# Patient Record
Sex: Female | Born: 1985
Health system: Southern US, Community
[De-identification: ages and names within clinical notes are randomized; demographics above are authoritative.]

## PROBLEM LIST (undated history)

## (undated) DIAGNOSIS — R519 Headache, unspecified: Secondary | ICD-10-CM

## (undated) DIAGNOSIS — K219 Gastro-esophageal reflux disease without esophagitis: Secondary | ICD-10-CM

## (undated) DIAGNOSIS — R51 Headache: Secondary | ICD-10-CM

## (undated) DIAGNOSIS — O34219 Maternal care for unspecified type scar from previous cesarean delivery: Secondary | ICD-10-CM

## (undated) HISTORY — PX: APPENDECTOMY: SHX54

---

## 2017-09-08 DIAGNOSIS — Z363 Encounter for antenatal screening for malformations: Secondary | ICD-10-CM | POA: Diagnosis not present

## 2017-09-08 DIAGNOSIS — K219 Gastro-esophageal reflux disease without esophagitis: Secondary | ICD-10-CM | POA: Diagnosis not present

## 2017-09-08 DIAGNOSIS — O34211 Maternal care for low transverse scar from previous cesarean delivery: Secondary | ICD-10-CM | POA: Diagnosis not present

## 2017-09-08 DIAGNOSIS — Z3A17 17 weeks gestation of pregnancy: Secondary | ICD-10-CM | POA: Diagnosis not present

## 2017-09-08 DIAGNOSIS — O219 Vomiting of pregnancy, unspecified: Secondary | ICD-10-CM | POA: Diagnosis not present

## 2017-10-11 DIAGNOSIS — Z3A22 22 weeks gestation of pregnancy: Secondary | ICD-10-CM | POA: Diagnosis not present

## 2017-10-11 DIAGNOSIS — Z362 Encounter for other antenatal screening follow-up: Secondary | ICD-10-CM | POA: Diagnosis not present

## 2017-10-19 DIAGNOSIS — Z3A23 23 weeks gestation of pregnancy: Secondary | ICD-10-CM | POA: Diagnosis not present

## 2017-10-19 DIAGNOSIS — R52 Pain, unspecified: Secondary | ICD-10-CM | POA: Diagnosis not present

## 2017-10-19 LAB — OB RESULTS CONSOLE HEPATITIS B SURFACE ANTIGEN: HEP B S AG: NEGATIVE

## 2017-10-19 LAB — OB RESULTS CONSOLE GC/CHLAMYDIA
Chlamydia: NEGATIVE
Gonorrhea: NEGATIVE

## 2017-10-19 LAB — OB RESULTS CONSOLE ABO/RH: RH Type: POSITIVE

## 2017-10-19 LAB — OB RESULTS CONSOLE HIV ANTIBODY (ROUTINE TESTING): HIV: NONREACTIVE

## 2017-10-19 LAB — OB RESULTS CONSOLE ANTIBODY SCREEN: Antibody Screen: NEGATIVE

## 2017-10-19 LAB — OB RESULTS CONSOLE RUBELLA ANTIBODY, IGM: RUBELLA: IMMUNE

## 2017-10-19 LAB — OB RESULTS CONSOLE RPR: RPR: NONREACTIVE

## 2017-11-21 DIAGNOSIS — Z23 Encounter for immunization: Secondary | ICD-10-CM | POA: Diagnosis not present

## 2017-11-21 DIAGNOSIS — Z3689 Encounter for other specified antenatal screening: Secondary | ICD-10-CM | POA: Diagnosis not present

## 2017-12-14 DIAGNOSIS — M546 Pain in thoracic spine: Secondary | ICD-10-CM | POA: Diagnosis not present

## 2017-12-14 DIAGNOSIS — M9903 Segmental and somatic dysfunction of lumbar region: Secondary | ICD-10-CM | POA: Diagnosis not present

## 2017-12-14 DIAGNOSIS — M9901 Segmental and somatic dysfunction of cervical region: Secondary | ICD-10-CM | POA: Diagnosis not present

## 2017-12-14 DIAGNOSIS — M9902 Segmental and somatic dysfunction of thoracic region: Secondary | ICD-10-CM | POA: Diagnosis not present

## 2018-01-19 DIAGNOSIS — Z3685 Encounter for antenatal screening for Streptococcus B: Secondary | ICD-10-CM | POA: Diagnosis not present

## 2018-01-19 LAB — OB RESULTS CONSOLE GBS: GBS: POSITIVE

## 2018-01-26 DIAGNOSIS — O34211 Maternal care for low transverse scar from previous cesarean delivery: Secondary | ICD-10-CM | POA: Diagnosis not present

## 2018-01-26 DIAGNOSIS — Z3A00 Weeks of gestation of pregnancy not specified: Secondary | ICD-10-CM | POA: Diagnosis not present

## 2018-01-30 ENCOUNTER — Telehealth (HOSPITAL_COMMUNITY): Payer: Self-pay | Admitting: *Deleted

## 2018-01-30 ENCOUNTER — Encounter (HOSPITAL_COMMUNITY): Payer: Self-pay | Admitting: *Deleted

## 2018-01-30 NOTE — Telephone Encounter (Signed)
Preadmission screen  

## 2018-02-01 HISTORY — DX: Maternal care for unspecified type scar from previous cesarean delivery: O34.219

## 2018-02-02 ENCOUNTER — Telehealth (HOSPITAL_COMMUNITY): Payer: Self-pay | Admitting: *Deleted

## 2018-02-02 NOTE — Telephone Encounter (Signed)
Preadmission screen  

## 2018-02-03 ENCOUNTER — Encounter (HOSPITAL_COMMUNITY): Payer: Self-pay

## 2018-02-06 ENCOUNTER — Encounter (HOSPITAL_COMMUNITY): Payer: Self-pay | Admitting: Obstetrics and Gynecology

## 2018-02-06 DIAGNOSIS — O34219 Maternal care for unspecified type scar from previous cesarean delivery: Secondary | ICD-10-CM

## 2018-02-06 NOTE — H&P (Signed)
Tracy Evans is a 33 y.o. female G2P1001 at 39wk for repeat cesarean section - given 39 weeks and no labor.  She is dated by LMP c/d early [place].  Has migraines w aura - worse outside of pregnancy.  Received Tdap and Flu vaccine w pregnancy.  Transfer of care from [place] at 17 weeks.  D/W her r/b/a of rLTCS.    OB History    Gravida  2   Para  1   Term  1   Preterm      AB      Living  1     SAB      TAB      Ectopic      Multiple      Live Births  1         no abn pap - last 3/18 No STD G1 03/03/16, 7#3, female - fetal intol of labor, arrest G2 present  Past Medical History:  Diagnosis Date  . GERD (gastroesophageal reflux disease)   . H/O cesarean section complicating pregnancy 02/06/2018  . Headache   h/o kidney stones  Past Surgical History:  Procedure Laterality Date  . APPENDECTOMY    . CESAREAN SECTION     Family History: family history includes Cancer in her maternal grandmother and paternal grandmother.asthma, CVA, hearing loss, kidney stones Social History:  reports that she has never smoked. She has never used smokeless tobacco. She reports previous alcohol use. She reports that she does not use drugs.married SAHM  Meds Nexium, PNV All NKDA     Maternal Diabetes: No Genetic Screening: Declined Maternal Ultrasounds/Referrals: Normal Fetal Ultrasounds or other Referrals:  None Maternal Substance Abuse:  No Significant Maternal Medications:  None Significant Maternal Lab Results:  Lab values include: Group B Strep positive Other Comments:  None  Review of Systems  Constitutional: Negative.   HENT: Negative.   Eyes: Negative.   Respiratory: Negative.   Cardiovascular: Negative.   Gastrointestinal: Negative.   Genitourinary: Negative.   Musculoskeletal: Negative.   Skin: Negative.   Neurological: Negative.   Psychiatric/Behavioral: Negative.    Maternal Medical History:  Contractions: Frequency: irregular.   Perceived severity is  moderate.    Fetal activity: Perceived fetal activity is normal.    Prenatal complications: no prenatal complications Prenatal Complications - Diabetes: none.      Last menstrual period 05/09/2017. Maternal Exam:  Uterine Assessment: Contraction strength is moderate.  Contraction frequency is irregular.   Abdomen: Patient reports no abdominal tenderness. Surgical scars: low transverse.   Fundal height is appropriate for gestation.   Estimated fetal weight is 7-8#.   Fetal presentation: vertex  Introitus: Normal vulva. Normal vagina.    Physical Exam  Constitutional: She appears well-developed and well-nourished.  HENT:  Head: Normocephalic and atraumatic.  Cardiovascular: Normal rate and regular rhythm.  Respiratory: Effort normal and breath sounds normal. No respiratory distress. She has no wheezes.  GI: Soft. Bowel sounds are normal. She exhibits no distension. There is no abdominal tenderness.  Genitourinary:    Vulva normal.   Musculoskeletal: Normal range of motion.  Neurological: She is alert.  Skin: Skin is warm and dry.  Psychiatric: She has a normal mood and affect. Her behavior is normal.    Prenatal labs: ABO, Rh: AB/Positive/-- (09/18 0000) Antibody: Negative (09/18 0000) Rubella: Immune (09/18 0000) RPR: Nonreactive (09/18 0000)  HBsAg: Negative (09/18 0000)  HIV: Non-reactive (09/18 0000)  GBS: Positive (12/19 0000)  Hgb 12.9/ Plt 246/Ur cx neg/GC neg/Chl  neg/glucola 117  Nl limited anat, ant plac, female F/u completes anatomy  Assessment/Plan: 32yo G2P1001 at 39+ for rLTCS D/w pt r/b/a and process - voices understanding - wishes to proceed Ancef for prophylaxis GBBS + - nursery aware.    Tracy Evans 02/06/2018, 10:02 AM

## 2018-02-08 ENCOUNTER — Encounter (HOSPITAL_COMMUNITY)
Admission: RE | Admit: 2018-02-08 | Discharge: 2018-02-08 | Disposition: A | Discharge status: Home / Self Care | Payer: BLUE CROSS/BLUE SHIELD | Source: Ambulatory Visit | Attending: Obstetrics and Gynecology | Admitting: Obstetrics and Gynecology

## 2018-02-08 ENCOUNTER — Inpatient Hospital Stay (EMERGENCY_DEPARTMENT_HOSPITAL)
Admission: AD | Admit: 2018-02-08 | Discharge: 2018-02-08 | Disposition: A | Discharge status: Home / Self Care | Payer: BLUE CROSS/BLUE SHIELD | Source: Home / Self Care | Attending: Obstetrics and Gynecology | Admitting: Obstetrics and Gynecology

## 2018-02-08 ENCOUNTER — Other Ambulatory Visit: Payer: Self-pay

## 2018-02-08 ENCOUNTER — Encounter (HOSPITAL_COMMUNITY): Payer: Self-pay | Admitting: *Deleted

## 2018-02-08 DIAGNOSIS — O471 False labor at or after 37 completed weeks of gestation: Secondary | ICD-10-CM | POA: Diagnosis not present

## 2018-02-08 DIAGNOSIS — O99824 Streptococcus B carrier state complicating childbirth: Secondary | ICD-10-CM | POA: Diagnosis not present

## 2018-02-08 DIAGNOSIS — Z23 Encounter for immunization: Secondary | ICD-10-CM | POA: Diagnosis not present

## 2018-02-08 DIAGNOSIS — O34219 Maternal care for unspecified type scar from previous cesarean delivery: Secondary | ICD-10-CM

## 2018-02-08 DIAGNOSIS — Z3A39 39 weeks gestation of pregnancy: Secondary | ICD-10-CM

## 2018-02-08 DIAGNOSIS — Z0371 Encounter for suspected problem with amniotic cavity and membrane ruled out: Secondary | ICD-10-CM | POA: Diagnosis not present

## 2018-02-08 DIAGNOSIS — Z3A00 Weeks of gestation of pregnancy not specified: Secondary | ICD-10-CM | POA: Diagnosis not present

## 2018-02-08 DIAGNOSIS — O133 Gestational [pregnancy-induced] hypertension without significant proteinuria, third trimester: Secondary | ICD-10-CM | POA: Diagnosis not present

## 2018-02-08 DIAGNOSIS — O479 False labor, unspecified: Secondary | ICD-10-CM

## 2018-02-08 DIAGNOSIS — O34211 Maternal care for low transverse scar from previous cesarean delivery: Secondary | ICD-10-CM | POA: Diagnosis not present

## 2018-02-08 HISTORY — DX: Gastro-esophageal reflux disease without esophagitis: K21.9

## 2018-02-08 HISTORY — DX: Headache: R51

## 2018-02-08 HISTORY — DX: Headache, unspecified: R51.9

## 2018-02-08 LAB — URINALYSIS, ROUTINE W REFLEX MICROSCOPIC
Bilirubin Urine: NEGATIVE
GLUCOSE, UA: NEGATIVE mg/dL
Ketones, ur: NEGATIVE mg/dL
LEUKOCYTES UA: NEGATIVE
Nitrite: NEGATIVE
Protein, ur: NEGATIVE mg/dL
Specific Gravity, Urine: 1.008 (ref 1.005–1.030)
pH: 7 (ref 5.0–8.0)

## 2018-02-08 LAB — COMPREHENSIVE METABOLIC PANEL
ALT: 15 U/L (ref 0–44)
AST: 18 U/L (ref 15–41)
Albumin: 3.3 g/dL — ABNORMAL LOW (ref 3.5–5.0)
Alkaline Phosphatase: 99 U/L (ref 38–126)
Anion gap: 8 (ref 5–15)
BILIRUBIN TOTAL: 0.4 mg/dL (ref 0.3–1.2)
BUN: 8 mg/dL (ref 6–20)
CO2: 21 mmol/L — ABNORMAL LOW (ref 22–32)
Calcium: 8.8 mg/dL — ABNORMAL LOW (ref 8.9–10.3)
Chloride: 104 mmol/L (ref 98–111)
Creatinine, Ser: 0.58 mg/dL (ref 0.44–1.00)
Glucose, Bld: 90 mg/dL (ref 70–99)
Potassium: 4.1 mmol/L (ref 3.5–5.1)
Sodium: 133 mmol/L — ABNORMAL LOW (ref 135–145)
Total Protein: 7 g/dL (ref 6.5–8.1)

## 2018-02-08 LAB — TYPE AND SCREEN
ABO/RH(D): AB POS
Antibody Screen: NEGATIVE

## 2018-02-08 LAB — CBC
HEMATOCRIT: 40.7 % (ref 36.0–46.0)
Hemoglobin: 13.8 g/dL (ref 12.0–15.0)
MCH: 31.6 pg (ref 26.0–34.0)
MCHC: 33.9 g/dL (ref 30.0–36.0)
MCV: 93.1 fL (ref 80.0–100.0)
Platelets: 197 10*3/uL (ref 150–400)
RBC: 4.37 MIL/uL (ref 3.87–5.11)
RDW: 12.4 % (ref 11.5–15.5)
WBC: 12.4 10*3/uL — ABNORMAL HIGH (ref 4.0–10.5)
nRBC: 0 % (ref 0.0–0.2)

## 2018-02-08 LAB — PROTEIN / CREATININE RATIO, URINE
Creatinine, Urine: 51 mg/dL
Protein Creatinine Ratio: 0.14 mg/mg{Cre} (ref 0.00–0.15)
Total Protein, Urine: 7 mg/dL

## 2018-02-08 LAB — ABO/RH: ABO/RH(D): AB POS

## 2018-02-08 NOTE — MAU Note (Signed)
Presents with c/o LOF since yesterday and ctxs that began last night.  Reports +FM.  Denies VB.

## 2018-02-08 NOTE — Patient Instructions (Signed)
NANDI TEAHAN  02/08/2018   Your procedure is scheduled on:  02/09/2018  Enter through the Main Entrance of [facility] at 0730 AM.  Pick up the phone at the desk and dial [phone]  Call this number if you have problems the morning of surgery:[phone]  Remember:   Do not eat food:(After Midnight) Desps de medianoche.  Do not drink clear liquids: (After Midnight) Desps de medianoche.  Take these medicines the morning of surgery with A SIP OF WATER: may take nexium   Do not wear jewelry, make-up or nail polish.  Do not wear lotions, powders, or perfumes. Do not wear deodorant.  Do not shave 48 hours prior to surgery.  Do not bring valuables to the hospital.  [facility] is not   responsible for any belongings or valuables brought to the hospital.  Contacts, dentures or bridgework may not be worn into surgery.  Leave suitcase in the car. After surgery it may be brought to your room.  For patients admitted to the hospital, checkout time is 11:00 AM the day of              discharge.    N/A   Please read over the following fact sheets that you were given:   Surgical Site Infection Prevention

## 2018-02-08 NOTE — MAU Provider Note (Signed)
Chief Complaint:  Contractions and Rupture of Membranes   None     HPI: Tracy Evans is a 33 y.o. G2P1001 at 615w2d by LMP, who presents to maternity admissions reporting leakage of fluid and cramping/contractions. She has scheduled repeat C/S tomorrow, 02/09/2018.  She reports clear fluid, enough to wet a pantyliner x 3 episodes in 24 hours.  She has not required a pad.  There are contractions with mild pain low in her abdomen every 10 minutes or so  It does not radiate.  She has not tried any treatments. There are no other symptoms. She reports good fetal movement.  HPI  Past Medical History: Past Medical History:  Diagnosis Date  . GERD (gastroesophageal reflux disease)   . H/O cesarean section complicating pregnancy 02/06/2018  . Headache     Past obstetric history: OB History  Gravida Para Term Preterm AB Living  2 1 1     1   SAB TAB Ectopic Multiple Live Births          1    # Outcome Date GA Lbr Len/2nd Weight Sex Delivery Anes PTL Lv  2 Current           1 Term 2018    M CS-LTranv   LIV    Past Surgical History: Past Surgical History:  Procedure Laterality Date  . APPENDECTOMY    . CESAREAN SECTION      Family History: Family History  Problem Relation Age of Onset  . Cancer Maternal Grandmother        br  . Cancer Paternal Grandmother        colon  . Healthy Mother   . Healthy Father     Social History: Social History   Tobacco Use  . Smoking status: Never Smoker  . Smokeless tobacco: Never Used  Substance Use Topics  . Alcohol use: Not Currently  . Drug use: Never    Allergies: No Known Allergies  Meds:  Medications Prior to Admission  Medication Sig Dispense Refill Last Dose  . acetaminophen (TYLENOL) 500 MG tablet Take 1,000 mg by mouth every 8 (eight) hours as needed (pain).     Marland Kitchen. docusate sodium (COLACE) 100 MG capsule Take 300 mg by mouth daily as needed for mild constipation.     Marland Kitchen. doxylamine, Sleep, (UNISOM) 25 MG tablet Take 12.5 mg  by mouth at bedtime.   02/03/2018 at Unknown time  . esomeprazole (NEXIUM) 40 MG capsule Take 40 mg by mouth daily at 12 noon.   02/03/2018 at Unknown time  . Prenatal MV-Min-Fe Fum-FA-DHA (PRENATAL 1 PO) Take 1 tablet by mouth daily. Dannielle Huhainbow Light Brand   02/03/2018 at Unknown time  . pyridOXINE (VITAMIN B-6) 25 MG tablet Take 25 mg by mouth daily.   02/03/2018 at Unknown time    ROS:  Review of Systems  Constitutional: Negative for chills, fatigue and fever.  Eyes: Negative for visual disturbance.  Respiratory: Negative for cough and shortness of breath.   Cardiovascular: Negative for chest pain.  Gastrointestinal: Positive for abdominal pain. Negative for nausea and vomiting.  Genitourinary: Positive for vaginal discharge. Negative for difficulty urinating, dysuria, flank pain, frequency, pelvic pain, urgency, vaginal bleeding and vaginal pain.  Musculoskeletal: Negative.   Neurological: Negative for dizziness and headaches.  Psychiatric/Behavioral: Negative.      I have reviewed patient's Past Medical Hx, Surgical Hx, Family Hx, Social Hx, medications and allergies.   Physical Exam   Patient Vitals for the past 24 hrs:  BP Temp Temp src Pulse Resp SpO2 Height Weight  02/08/18 1115 140/80 - - (!) 101 - 98 % - -  02/08/18 1016 131/80 - - (!) 116 - 98 % - -  02/08/18 1007 - - - - - 98 % - -  02/08/18 1001 140/87 - - (!) 122 - 98 % - -  02/08/18 0945 - - - - - 98 % - -  02/08/18 0944 (!) 149/88 - - (!) 120 - - - -  02/08/18 0923 (!) 149/98 98.2 F (36.8 C) Oral (!) 143 18 100 % - -  02/08/18 0919 - - - - - - 5\' 8"  (1.727 m) 99.7 kg   Constitutional: Well-developed, well-nourished female in no acute distress.  Cardiovascular: normal rate Respiratory: normal effort GI: Abd soft, non-tender, gravid appropriate for gestational age.  MS: Extremities nontender, no edema, normal ROM Neurologic: Alert and oriented x 4.  GU: Neg CVAT.  PELVIC EXAM: Cervix pink, visually closed, without  lesion, small amount tan creamy discharge, no pooling of fluid with valsalva, vaginal walls and external genitalia normal  Dilation: Closed Effacement (%): 30 Cervical Position: Posterior Exam by:: Dr. Ellyn Hack  FHT:  Baseline 150 , moderate variability, accelerations present, no decelerations Contractions: irritability and contractions q 15 mins   Labs: Results for orders placed or performed during the hospital encounter of 02/08/18 (from the past 24 hour(s))  Protein / creatinine ratio, urine     Status: None   Collection Time: 02/08/18  9:23 AM  Result Value Ref Range   Creatinine, Urine 51.00 mg/dL   Total Protein, Urine 7 mg/dL   Protein Creatinine Ratio 0.14 0.00 - 0.15 mg/mg[Cre]  Urinalysis, Routine w reflex microscopic     Status: Abnormal   Collection Time: 02/08/18  9:30 AM  Result Value Ref Range   Color, Urine YELLOW YELLOW   APPearance HAZY (A) CLEAR   Specific Gravity, Urine 1.008 1.005 - 1.030   pH 7.0 5.0 - 8.0   Glucose, UA NEGATIVE NEGATIVE mg/dL   Hgb urine dipstick MODERATE (A) NEGATIVE   Bilirubin Urine NEGATIVE NEGATIVE   Ketones, ur NEGATIVE NEGATIVE mg/dL   Protein, ur NEGATIVE NEGATIVE mg/dL   Nitrite NEGATIVE NEGATIVE   Leukocytes, UA NEGATIVE NEGATIVE   RBC / HPF 0-5 0 - 5 RBC/hpf   WBC, UA 0-5 0 - 5 WBC/hpf   Bacteria, UA RARE (A) NONE SEEN   Squamous Epithelial / LPF 0-5 0 - 5   Mucus PRESENT   CBC     Status: Abnormal   Collection Time: 02/08/18 10:13 AM  Result Value Ref Range   WBC 12.4 (H) 4.0 - 10.5 K/uL   RBC 4.37 3.87 - 5.11 MIL/uL   Hemoglobin 13.8 12.0 - 15.0 g/dL   HCT 21.9 75.8 - 83.2 %   MCV 93.1 80.0 - 100.0 fL   MCH 31.6 26.0 - 34.0 pg   MCHC 33.9 30.0 - 36.0 g/dL   RDW 54.9 82.6 - 41.5 %   Platelets 197 150 - 400 K/uL   nRBC 0.0 0.0 - 0.2 %  Comprehensive metabolic panel     Status: Abnormal   Collection Time: 02/08/18 10:13 AM  Result Value Ref Range   Sodium 133 (L) 135 - 145 mmol/L   Potassium 4.1 3.5 - 5.1 mmol/L    Chloride 104 98 - 111 mmol/L   CO2 21 (L) 22 - 32 mmol/L   Glucose, Bld 90 70 - 99 mg/dL   BUN 8  6 - 20 mg/dL   Creatinine, Ser 9.600.58 0.44 - 1.00 mg/dL   Calcium 8.8 (L) 8.9 - 10.3 mg/dL   Total Protein 7.0 6.5 - 8.1 g/dL   Albumin 3.3 (L) 3.5 - 5.0 g/dL   AST 18 15 - 41 U/L   ALT 15 0 - 44 U/L   Alkaline Phosphatase 99 38 - 126 U/L   Total Bilirubin 0.4 0.3 - 1.2 mg/dL   GFR calc non Af Amer >60 >60 mL/min   GFR calc Af Amer >60 >60 mL/min   Anion gap 8 5 - 15   AB/Positive/-- (09/18 0000)  Imaging:  No results found.  MAU Course/MDM: Orders Placed This Encounter  Procedures  . Urinalysis, Routine w reflex microscopic  . CBC  . Comprehensive metabolic panel  . Protein / creatinine ratio, urine  . RPR  . External Fetal Monitoring for all patients >/= 23 weeks  . Assess fetal heart tones for all patients >/= 12 weeks  . Type and screen  . Discharge patient    No orders of the defined types were placed in this encounter.    NST reviewed and reactive Dr Hinton RaoBovard-Stuckert to MAU to see pt and examine cervix.   In absence of preeclampsia symptoms and with normal labs, and pt normotensive except for 2 intake BPs today, pt does not meet criteria for delivery for gestational hypertension She is to keep her scheduled C/S tomorrow and compete her preop appointment today after D/C from MAU Preeclampsia and labor precautions reviewed Pt discharge with strict return precautions.    Assessment: 1. Transient hypertension of pregnancy in third trimester   2. H/O cesarean section complicating pregnancy   3. False labor   4. Encounter for suspected premature rupture of amniotic membranes, with rupture of membranes not found     Plan: Discharge home Labor precautions and fetal kick counts Follow-up Information    [facility].   Why:  Thursday, 02/09/2018, for your scheduled cesarean section. Return to MAU for signs of labor or emergencies. Contact  information: [address] [place] [phone]         Allergies as of 02/08/2018   No Known Allergies     Medication List    TAKE these medications   acetaminophen 500 MG tablet Commonly known as:  TYLENOL Take 1,000 mg by mouth every 8 (eight) hours as needed (pain).   docusate sodium 100 MG capsule Commonly known as:  COLACE Take 300 mg by mouth daily as needed for mild constipation.   doxylamine (Sleep) 25 MG tablet Commonly known as:  UNISOM Take 12.5 mg by mouth at bedtime.   esomeprazole 40 MG capsule Commonly known as:  NEXIUM Take 40 mg by mouth daily at 12 noon.   PRENATAL 1 PO Take 1 tablet by mouth daily. Rainbow Light Brand   pyridOXINE 25 MG tablet Commonly known as:  VITAMIN B-6 Take 25 mg by mouth daily.       Sharen CounterLisa Leftwich-Kirby Certified Nurse-Midwife 02/08/2018 11:22 AM

## 2018-02-08 NOTE — Discharge Instructions (Signed)

## 2018-02-09 ENCOUNTER — Inpatient Hospital Stay (HOSPITAL_COMMUNITY): Payer: BLUE CROSS/BLUE SHIELD | Admitting: Anesthesiology

## 2018-02-09 ENCOUNTER — Encounter (HOSPITAL_COMMUNITY): Payer: Self-pay | Admitting: *Deleted

## 2018-02-09 ENCOUNTER — Inpatient Hospital Stay (HOSPITAL_COMMUNITY)
Admission: AD | Admit: 2018-02-09 | Discharge: 2018-02-11 | DRG: 788 | Disposition: A | Discharge status: Home / Self Care | Payer: BLUE CROSS/BLUE SHIELD | Attending: Obstetrics and Gynecology | Admitting: Obstetrics and Gynecology

## 2018-02-09 ENCOUNTER — Encounter (HOSPITAL_COMMUNITY)
Admission: AD | Disposition: A | Discharge status: Home / Self Care | Payer: Self-pay | Source: Home / Self Care | Attending: Obstetrics and Gynecology

## 2018-02-09 DIAGNOSIS — Z3A39 39 weeks gestation of pregnancy: Secondary | ICD-10-CM

## 2018-02-09 DIAGNOSIS — O34211 Maternal care for low transverse scar from previous cesarean delivery: Principal | ICD-10-CM | POA: Diagnosis present

## 2018-02-09 DIAGNOSIS — O99824 Streptococcus B carrier state complicating childbirth: Secondary | ICD-10-CM | POA: Diagnosis present

## 2018-02-09 DIAGNOSIS — O34219 Maternal care for unspecified type scar from previous cesarean delivery: Secondary | ICD-10-CM | POA: Diagnosis present

## 2018-02-09 DIAGNOSIS — Z98891 History of uterine scar from previous surgery: Secondary | ICD-10-CM

## 2018-02-09 LAB — RPR: RPR: NONREACTIVE

## 2018-02-09 SURGERY — Surgical Case
Anesthesia: Spinal

## 2018-02-09 MED ORDER — EPHEDRINE 5 MG/ML INJ
INTRAVENOUS | Status: AC
  Filled 2018-02-09: qty 10

## 2018-02-09 MED ORDER — CEFAZOLIN SODIUM-DEXTROSE 2-4 GM/100ML-% IV SOLN
2.0000 g | Freq: Once | INTRAVENOUS | Status: AC
  Administered 2018-02-09: 2 g via INTRAVENOUS
  Filled 2018-02-09: qty 100

## 2018-02-09 MED ORDER — SENNOSIDES-DOCUSATE SODIUM 8.6-50 MG PO TABS
2.0000 | ORAL_TABLET | ORAL | Status: DC
  Administered 2018-02-10 (×2): 2 via ORAL
  Filled 2018-02-09 (×2): qty 2

## 2018-02-09 MED ORDER — NALBUPHINE HCL 10 MG/ML IJ SOLN: 5.0000 mg | Freq: Once | INTRAMUSCULAR | Status: DC | PRN

## 2018-02-09 MED ORDER — ONDANSETRON HCL 4 MG/2ML IJ SOLN: 4.0000 mg | Freq: Three times a day (TID) | INTRAMUSCULAR | Status: DC | PRN

## 2018-02-09 MED ORDER — KETOROLAC TROMETHAMINE 30 MG/ML IJ SOLN
INTRAMUSCULAR | Status: AC
  Administered 2018-02-09: 30 mg via INTRAMUSCULAR
  Filled 2018-02-09: qty 1

## 2018-02-09 MED ORDER — MENTHOL 3 MG MT LOZG: 1.0000 | LOZENGE | OROMUCOSAL | Status: DC | PRN

## 2018-02-09 MED ORDER — SCOPOLAMINE 1 MG/3DAYS TD PT72
MEDICATED_PATCH | TRANSDERMAL | Status: AC
  Filled 2018-02-09: qty 1

## 2018-02-09 MED ORDER — MORPHINE SULFATE (PF) 0.5 MG/ML IJ SOLN
INTRAMUSCULAR | Status: AC
  Filled 2018-02-09: qty 10

## 2018-02-09 MED ORDER — TETANUS-DIPHTH-ACELL PERTUSSIS 5-2.5-18.5 LF-MCG/0.5 IM SUSP: 0.5000 mL | Freq: Once | INTRAMUSCULAR | Status: DC

## 2018-02-09 MED ORDER — COCONUT OIL OIL
1.0000 "application " | TOPICAL_OIL | Status: DC | PRN
  Administered 2018-02-10: 1 via TOPICAL
  Filled 2018-02-09: qty 120

## 2018-02-09 MED ORDER — DIPHENHYDRAMINE HCL 50 MG/ML IJ SOLN: 12.5000 mg | INTRAMUSCULAR | Status: DC | PRN

## 2018-02-09 MED ORDER — OXYTOCIN 40 UNITS IN NORMAL SALINE INFUSION - SIMPLE MED: 2.5000 [IU]/h | INTRAVENOUS | Status: DC

## 2018-02-09 MED ORDER — FENTANYL CITRATE (PF) 100 MCG/2ML IJ SOLN
INTRAMUSCULAR | Status: DC | PRN
  Administered 2018-02-09: 15 ug via INTRATHECAL

## 2018-02-09 MED ORDER — WITCH HAZEL-GLYCERIN EX PADS: 1.0000 "application " | MEDICATED_PAD | CUTANEOUS | Status: DC | PRN

## 2018-02-09 MED ORDER — LACTATED RINGERS IV SOLN
INTRAVENOUS | Status: DC
  Administered 2018-02-09: 17:00:00 via INTRAVENOUS

## 2018-02-09 MED ORDER — SCOPOLAMINE 1 MG/3DAYS TD PT72: 1.0000 | MEDICATED_PATCH | Freq: Once | TRANSDERMAL | Status: DC

## 2018-02-09 MED ORDER — ONDANSETRON HCL 4 MG/2ML IJ SOLN
4.0000 mg | Freq: Four times a day (QID) | INTRAMUSCULAR | Status: DC
  Administered 2018-02-09: 4 mg via INTRAVENOUS
  Filled 2018-02-09: qty 2

## 2018-02-09 MED ORDER — DIPHENHYDRAMINE HCL 25 MG PO CAPS: 25.0000 mg | ORAL_CAPSULE | Freq: Four times a day (QID) | ORAL | Status: DC | PRN

## 2018-02-09 MED ORDER — PHENYLEPHRINE 8 MG IN D5W 100 ML (0.08MG/ML) PREMIX OPTIME
INJECTION | INTRAVENOUS | Status: AC
  Filled 2018-02-09: qty 100

## 2018-02-09 MED ORDER — PANTOPRAZOLE SODIUM 40 MG PO TBEC
40.0000 mg | DELAYED_RELEASE_TABLET | Freq: Every day | ORAL | Status: DC
  Administered 2018-02-10: 40 mg via ORAL
  Filled 2018-02-09 (×2): qty 1

## 2018-02-09 MED ORDER — LACTATED RINGERS IV SOLN
INTRAVENOUS | Status: DC
  Administered 2018-02-09 (×2): via INTRAVENOUS

## 2018-02-09 MED ORDER — MORPHINE SULFATE (PF) 0.5 MG/ML IJ SOLN
INTRAMUSCULAR | Status: DC | PRN
  Administered 2018-02-09: 150 mg via INTRATHECAL

## 2018-02-09 MED ORDER — ONDANSETRON HCL 4 MG/2ML IJ SOLN
INTRAMUSCULAR | Status: AC
  Filled 2018-02-09: qty 2

## 2018-02-09 MED ORDER — MEPERIDINE HCL 25 MG/ML IJ SOLN: 6.2500 mg | INTRAMUSCULAR | Status: DC | PRN

## 2018-02-09 MED ORDER — BUPIVACAINE IN DEXTROSE 0.75-8.25 % IT SOLN
INTRATHECAL | Status: DC | PRN
  Administered 2018-02-09: 1.6 mL via INTRATHECAL

## 2018-02-09 MED ORDER — ONDANSETRON HCL 4 MG/2ML IJ SOLN
INTRAMUSCULAR | Status: DC | PRN
  Administered 2018-02-09: 4 mg via INTRAVENOUS

## 2018-02-09 MED ORDER — LACTATED RINGERS IV SOLN: INTRAVENOUS | Status: DC

## 2018-02-09 MED ORDER — NALOXONE HCL 0.4 MG/ML IJ SOLN: 0.4000 mg | INTRAMUSCULAR | Status: DC | PRN

## 2018-02-09 MED ORDER — PHENYLEPHRINE 8 MG IN D5W 100 ML (0.08MG/ML) PREMIX OPTIME
INJECTION | INTRAVENOUS | Status: DC | PRN
  Administered 2018-02-09: 60 ug/min via INTRAVENOUS

## 2018-02-09 MED ORDER — OXYTOCIN 10 UNIT/ML IJ SOLN
INTRAMUSCULAR | Status: AC
  Filled 2018-02-09: qty 4

## 2018-02-09 MED ORDER — KETOROLAC TROMETHAMINE 30 MG/ML IJ SOLN: 30.0000 mg | Freq: Four times a day (QID) | INTRAMUSCULAR | Status: DC | PRN

## 2018-02-09 MED ORDER — DEXAMETHASONE SODIUM PHOSPHATE 4 MG/ML IJ SOLN
INTRAMUSCULAR | Status: AC
  Filled 2018-02-09: qty 1

## 2018-02-09 MED ORDER — PHENYLEPHRINE 40 MCG/ML (10ML) SYRINGE FOR IV PUSH (FOR BLOOD PRESSURE SUPPORT)
PREFILLED_SYRINGE | INTRAVENOUS | Status: AC
  Filled 2018-02-09: qty 10

## 2018-02-09 MED ORDER — NALOXONE HCL 4 MG/10ML IJ SOLN
1.0000 ug/kg/h | INTRAVENOUS | Status: DC | PRN
  Filled 2018-02-09: qty 5

## 2018-02-09 MED ORDER — NALBUPHINE HCL 10 MG/ML IJ SOLN: 5.0000 mg | INTRAMUSCULAR | Status: DC | PRN

## 2018-02-09 MED ORDER — DEXAMETHASONE SODIUM PHOSPHATE 4 MG/ML IJ SOLN
INTRAMUSCULAR | Status: DC | PRN
  Administered 2018-02-09: 4 mg via INTRAVENOUS

## 2018-02-09 MED ORDER — ZOLPIDEM TARTRATE 5 MG PO TABS: 5.0000 mg | ORAL_TABLET | Freq: Every evening | ORAL | Status: DC | PRN

## 2018-02-09 MED ORDER — SIMETHICONE 80 MG PO CHEW
80.0000 mg | CHEWABLE_TABLET | Freq: Three times a day (TID) | ORAL | Status: DC
  Administered 2018-02-10 – 2018-02-11 (×4): 80 mg via ORAL
  Filled 2018-02-09 (×5): qty 1

## 2018-02-09 MED ORDER — DOCUSATE SODIUM 100 MG PO CAPS: 300.0000 mg | ORAL_CAPSULE | Freq: Every day | ORAL | Status: DC | PRN

## 2018-02-09 MED ORDER — OXYCODONE HCL 5 MG PO TABS
5.0000 mg | ORAL_TABLET | ORAL | Status: DC | PRN
  Administered 2018-02-10 – 2018-02-11 (×2): 10 mg via ORAL
  Filled 2018-02-09 (×2): qty 2

## 2018-02-09 MED ORDER — DIPHENHYDRAMINE HCL 25 MG PO CAPS
25.0000 mg | ORAL_CAPSULE | ORAL | Status: DC | PRN
  Filled 2018-02-09: qty 1

## 2018-02-09 MED ORDER — PROMETHAZINE HCL 25 MG/ML IJ SOLN: 6.2500 mg | INTRAMUSCULAR | Status: DC | PRN

## 2018-02-09 MED ORDER — OXYTOCIN 10 UNIT/ML IJ SOLN
INTRAVENOUS | Status: DC | PRN
  Administered 2018-02-09: 40 [IU] via INTRAVENOUS

## 2018-02-09 MED ORDER — IBUPROFEN 800 MG PO TABS
800.0000 mg | ORAL_TABLET | Freq: Three times a day (TID) | ORAL | Status: DC
  Administered 2018-02-09 – 2018-02-11 (×6): 800 mg via ORAL
  Filled 2018-02-09 (×6): qty 1

## 2018-02-09 MED ORDER — SCOPOLAMINE 1 MG/3DAYS TD PT72
1.0000 | MEDICATED_PATCH | TRANSDERMAL | Status: DC
  Administered 2018-02-09: 1.5 mg via TRANSDERMAL

## 2018-02-09 MED ORDER — FENTANYL CITRATE (PF) 100 MCG/2ML IJ SOLN
INTRAMUSCULAR | Status: AC
  Filled 2018-02-09: qty 2

## 2018-02-09 MED ORDER — SIMETHICONE 80 MG PO CHEW
80.0000 mg | CHEWABLE_TABLET | ORAL | Status: DC
  Administered 2018-02-10 (×2): 80 mg via ORAL
  Filled 2018-02-09 (×2): qty 1

## 2018-02-09 MED ORDER — LACTATED RINGERS IV SOLN
INTRAVENOUS | Status: DC | PRN
  Administered 2018-02-09: 10:00:00 via INTRAVENOUS

## 2018-02-09 MED ORDER — PRENATAL MULTIVITAMIN CH
1.0000 | ORAL_TABLET | Freq: Every day | ORAL | Status: DC
  Administered 2018-02-10: 1 via ORAL
  Filled 2018-02-09 (×2): qty 1

## 2018-02-09 MED ORDER — SIMETHICONE 80 MG PO CHEW
80.0000 mg | CHEWABLE_TABLET | ORAL | Status: DC | PRN
  Administered 2018-02-09: 80 mg via ORAL

## 2018-02-09 MED ORDER — SODIUM CHLORIDE 0.9% FLUSH: 3.0000 mL | INTRAVENOUS | Status: DC | PRN

## 2018-02-09 MED ORDER — DIBUCAINE 1 % RE OINT: 1.0000 "application " | TOPICAL_OINTMENT | RECTAL | Status: DC | PRN

## 2018-02-09 MED ORDER — KETOROLAC TROMETHAMINE 30 MG/ML IJ SOLN
30.0000 mg | Freq: Four times a day (QID) | INTRAMUSCULAR | Status: DC | PRN
  Administered 2018-02-09: 30 mg via INTRAVENOUS
  Administered 2018-02-09: 30 mg via INTRAMUSCULAR
  Filled 2018-02-09: qty 1

## 2018-02-09 MED ORDER — FENTANYL CITRATE (PF) 100 MCG/2ML IJ SOLN: 25.0000 ug | INTRAMUSCULAR | Status: DC | PRN

## 2018-02-09 SURGICAL SUPPLY — 37 items
BENZOIN TINCTURE PRP APPL 2/3 (GAUZE/BANDAGES/DRESSINGS) ×3 IMPLANT
CHLORAPREP W/TINT 26ML (MISCELLANEOUS) ×3 IMPLANT
CLAMP CORD UMBIL (MISCELLANEOUS) IMPLANT
CLOSURE STERI STRIP 1/2 X4 (GAUZE/BANDAGES/DRESSINGS) ×2 IMPLANT
CLOTH BEACON ORANGE TIMEOUT ST (SAFETY) ×3 IMPLANT
DRSG OPSITE POSTOP 4X10 (GAUZE/BANDAGES/DRESSINGS) ×3 IMPLANT
ELECT REM PT RETURN 9FT ADLT (ELECTROSURGICAL) ×3
ELECTRODE REM PT RTRN 9FT ADLT (ELECTROSURGICAL) ×1 IMPLANT
EXTRACTOR VACUUM M CUP 4 TUBE (SUCTIONS) IMPLANT
EXTRACTOR VACUUM M CUP 4' TUBE (SUCTIONS)
GLOVE BIO SURGEON STRL SZ 6.5 (GLOVE) ×2 IMPLANT
GLOVE BIO SURGEONS STRL SZ 6.5 (GLOVE) ×1
GLOVE BIOGEL PI IND STRL 7.0 (GLOVE) ×1 IMPLANT
GLOVE BIOGEL PI INDICATOR 7.0 (GLOVE) ×2
GOWN STRL REUS W/TWL LRG LVL3 (GOWN DISPOSABLE) ×6 IMPLANT
KIT ABG SYR 3ML LUER SLIP (SYRINGE) IMPLANT
NDL HYPO 25X5/8 SAFETYGLIDE (NEEDLE) IMPLANT
NEEDLE HYPO 25X5/8 SAFETYGLIDE (NEEDLE) IMPLANT
NS IRRIG 1000ML POUR BTL (IV SOLUTION) ×3 IMPLANT
PACK C SECTION WH (CUSTOM PROCEDURE TRAY) ×3 IMPLANT
PAD OB MATERNITY 4.3X12.25 (PERSONAL CARE ITEMS) ×3 IMPLANT
PENCIL SMOKE EVAC W/HOLSTER (ELECTROSURGICAL) ×3 IMPLANT
RTRCTR C-SECT PINK 25CM LRG (MISCELLANEOUS) ×3 IMPLANT
SPONGE LAP 18X18 RF (DISPOSABLE) ×9 IMPLANT
STRIP CLOSURE SKIN 1/2X4 (GAUZE/BANDAGES/DRESSINGS) ×2 IMPLANT
SUT MNCRL 0 VIOLET CTX 36 (SUTURE) ×2 IMPLANT
SUT MONOCRYL 0 CTX 36 (SUTURE) ×4
SUT PLAIN 1 NONE 54 (SUTURE) IMPLANT
SUT PLAIN 2 0 XLH (SUTURE) ×3 IMPLANT
SUT VIC AB 0 CT1 27 (SUTURE) ×4
SUT VIC AB 0 CT1 27XBRD ANBCTR (SUTURE) ×2 IMPLANT
SUT VIC AB 2-0 CT1 27 (SUTURE) ×2
SUT VIC AB 2-0 CT1 TAPERPNT 27 (SUTURE) ×1 IMPLANT
SUT VIC AB 4-0 KS 27 (SUTURE) ×3 IMPLANT
SYR BULB IRRIGATION 50ML (SYRINGE) ×3 IMPLANT
TOWEL OR 17X24 6PK STRL BLUE (TOWEL DISPOSABLE) ×3 IMPLANT
TRAY FOLEY W/BAG SLVR 14FR LF (SET/KITS/TRAYS/PACK) ×3 IMPLANT

## 2018-02-09 NOTE — Addendum Note (Signed)
Addendum  created 02/09/18 1429 by Shanon Payor, CRNA   Clinical Note Signed

## 2018-02-09 NOTE — Consult Note (Signed)
Neonatology Note:   Attendance at C-section:    I was asked by Dr. Ellyn Hack to attend this repeat C/S at term. The mother is a G2P1, GBS + no labor or ROM with good prenatal care. Vacuum assist. ROM 0 hours before delivery, fluid clear. Infant vigorous with good spontaneous cry and tone. +60 sec DCC.  Needed only minimal bulb suctioning. Ap 8/9. Lungs clear to ausc in DR. Parents updated. To CN to care of Pediatrician.  Dineen Kid Leary Roca, MD Neonatologist 02/09/2018, 10:43 AM

## 2018-02-09 NOTE — Interval H&P Note (Signed)
History and Physical Interval Note:  02/09/2018 9:12 AM  Tracy Evans  has presented today for surgery, with the diagnosis of repeat C-Section  The various methods of treatment have been discussed with the patient and family. After consideration of risks, benefits and other options for treatment, the patient has consented to  Procedure(s) with comments: REPEAT CESAREAN SECTION (N/A) - [name],  RNFA as a surgical intervention .  The patient's history has been reviewed, patient examined, no change in status, stable for surgery.  I have reviewed the patient's chart and labs.  Questions were answered to the patient's satisfaction.     Tracy Evans

## 2018-02-09 NOTE — Progress Notes (Signed)
Patient ID: Tracy Evans, female   DOB: 11/17/1985, 33 y.o.   MRN: [id number] POD#1 Pt with no complaints. Pain well controlled, lochia mild. Bonding well with baby VSS ABD - FF, dressing c/d/i EXT - no edema  A/P: POD#0 s/p repeat c/s - stable          Routine post op/pp care

## 2018-02-09 NOTE — Anesthesia Postprocedure Evaluation (Signed)
Anesthesia Post Note  Patient: Tracy Evans  Procedure(s) Performed: REPEAT CESAREAN SECTION (N/A )     Patient location during evaluation: PACU Anesthesia Type: Spinal Level of consciousness: oriented and awake and alert Pain management: pain level controlled Vital Signs Assessment: post-procedure vital signs reviewed and stable Respiratory status: spontaneous breathing, respiratory function stable and patient connected to nasal cannula oxygen Cardiovascular status: blood pressure returned to baseline and stable Postop Assessment: no headache, no backache, no apparent nausea or vomiting and spinal receding Anesthetic complications: no    Last Vitals:  Vitals:   02/09/18 1251 02/09/18 1400  BP: 128/75 119/71  Pulse: 74   Resp: 17 18  Temp: 36.5 C 36.7 C  SpO2: 99% 98%    Last Pain:  Vitals:   02/09/18 1400  TempSrc: Oral  PainSc: 0-No pain   Pain Goal:                 Shelton Silvas

## 2018-02-09 NOTE — Anesthesia Preprocedure Evaluation (Addendum)
Anesthesia Evaluation  Patient identified by MRN, date of birth, ID band Patient awake    Reviewed: Allergy & Precautions, NPO status , Patient's Chart, lab work & pertinent test results  Airway Mallampati: I       Dental no notable dental hx.    Pulmonary neg pulmonary ROS,    Pulmonary exam normal        Cardiovascular negative cardio ROS Normal cardiovascular exam     Neuro/Psych  Headaches,    GI/Hepatic Neg liver ROS, GERD  ,  Endo/Other  negative endocrine ROS  Renal/GU negative Renal ROS  negative genitourinary   Musculoskeletal   Abdominal   Peds  Hematology   Anesthesia Other Findings   Reproductive/Obstetrics (+) Pregnancy                            Lab Results  Component Value Date   WBC 12.4 (H) 02/08/2018   HGB 13.8 02/08/2018   HCT 40.7 02/08/2018   MCV 93.1 02/08/2018   PLT 197 02/08/2018   No results found for: INR, PROTIME   Anesthesia Physical Anesthesia Plan  ASA: II  Anesthesia Plan: Spinal   Post-op Pain Management:    Induction:   PONV Risk Score and Plan: Ondansetron and Treatment may vary due to age or medical condition  Airway Management Planned: Natural Airway  Additional Equipment: None  Intra-op Plan:   Post-operative Plan:   Informed Consent: I have reviewed the patients History and Physical, chart, labs and discussed the procedure including the risks, benefits and alternatives for the proposed anesthesia with the patient or authorized representative who has indicated his/her understanding and acceptance.     Plan Discussed with: CRNA  Anesthesia Plan Comments:        Anesthesia Quick Evaluation

## 2018-02-09 NOTE — Brief Op Note (Signed)
02/09/2018  11:12 AM  PATIENT:  Tracy Evans  33 y.o. female  PRE-OPERATIVE DIAGNOSIS:  repeat C-Section  POST-OPERATIVE DIAGNOSIS:  repeat C-Section  PROCEDURE:  Procedure(s) with comments: REPEAT CESAREAN SECTION (N/A) - [name],  RNFA  SURGEON:  Surgeon(s) and Role:    * Bovard-Stuckert, Shontel Santee, MD - Primary  ASSISTANTS: Webb Silversmith, RNFA   ANESTHESIA:   spinal  FINDINGS: viable female infant at 10:06, apgars 8/9 wt P,  nl uterus, tuubes and ovaries  EBL:  277 mL IVF and uop per anesthesia, clear urine at end of case  BLOOD ADMINISTERED:none  DRAINS: Urinary Catheter (Foley)   LOCAL MEDICATIONS USED:  NONE  SPECIMEN:  Source of Specimen:  Placenta  DISPOSITION OF SPECIMEN:  PATHOLOGY  COUNTS:  YES  TOURNIQUET:  * No tourniquets in log *  DICTATION: .Other Dictation: Dictation Number [id number]  PLAN OF CARE: Admit to inpatient   PATIENT DISPOSITION:  PACU - hemodynamically stable.   Delay start of Pharmacological VTE agent (>24hrs) due to surgical blood loss or risk of bleeding: not applicable

## 2018-02-09 NOTE — Anesthesia Postprocedure Evaluation (Signed)
Anesthesia Post Note  Patient: Tracy Evans  Procedure(s) Performed: REPEAT CESAREAN SECTION (N/A )     Patient location during evaluation: Mother Baby Anesthesia Type: Spinal Level of consciousness: awake and alert and oriented Pain management: satisfactory to patient Vital Signs Assessment: post-procedure vital signs reviewed and stable Respiratory status: spontaneous breathing and nonlabored ventilation Cardiovascular status: stable Postop Assessment: no headache, no backache, patient able to bend at knees, no signs of nausea or vomiting, adequate PO intake, spinal receding and no apparent nausea or vomiting Anesthetic complications: no    Last Vitals:  Vitals:   02/09/18 1251 02/09/18 1400  BP: 128/75 119/71  Pulse: 74   Resp: 17 18  Temp: 36.5 C 36.7 C  SpO2: 99% 98%    Last Pain:  Vitals:   02/09/18 1400  TempSrc: Oral  PainSc: 0-No pain   Pain Goal:                 Tyrian Peart

## 2018-02-09 NOTE — Lactation Note (Signed)
This note was copied from a baby's chart. Lactation Consultation Note  Patient Name: Tracy Evans ZOXWR'UToday's Date: 02/09/2018 Reason for consult: Initial assessment;Term  P2 mother whose infant is now 807 hours old.  Mother breast fed her first child  (Now 35 years old) for 6610 months  Mother was feeding in the cradle position when I arrived.  I noticed a shallow latch and suggested a different position to assist baby better and mother willingly agreed.  Mother wanted to try the football position.  Mother's breasts are soft and non tender and nipples are everted.  Assisted to latch in the football hold on the right breast without difficulty.  Baby had wide gape, flanged lips and began sucking immediately.  Mother denied pain with latching.  Demonstrated breast compressions and mother was able to continue intermittent breast compressions.  Audible swallows noted.  Mother is familiar with hand expression and will do this before/after feedings.  Colostrum container provided and milk storage times reviewed.    Mom made aware of O/P services, breastfeeding support groups, community resources, and our phone # for post-discharge questions. Mother will be a "stay at home" mother and already has a DEBP for home use.  She will call for latch assistance as needed.  Father present and supportive.   Maternal Data Formula Feeding for Exclusion: No Has patient been taught Hand Expression?: Yes Does the patient have breastfeeding experience prior to this delivery?: Yes  Feeding Feeding Type: Breast Fed  LATCH Score Latch: Grasps breast easily, tongue down, lips flanged, rhythmical sucking.  Audible Swallowing: Spontaneous and intermittent  Type of Nipple: Everted at rest and after stimulation  Comfort (Breast/Nipple): Soft / non-tender  Hold (Positioning): Assistance needed to correctly position infant at breast and maintain latch.  LATCH Score: 9  Interventions Interventions: Breast feeding  basics reviewed;Assisted with latch;Skin to skin;Breast massage;Hand express;Breast compression;Position options;Support pillows;Adjust position  Lactation Tools Discussed/Used WIC Program: No   Consult Status Consult Status: Follow-up Date: 02/10/18 Follow-up type: In-patient    [name] 02/09/2018, 5:49 PM

## 2018-02-09 NOTE — Transfer of Care (Signed)
Immediate Anesthesia Transfer of Care Note  Patient: Tracy Evans  Procedure(s) Performed: REPEAT CESAREAN SECTION (N/A )  Patient Location: PACU  Anesthesia Type:Spinal  Level of Consciousness: awake, alert  and oriented  Airway & Oxygen Therapy: Patient Spontanous Breathing  Post-op Assessment: Report given to RN and Post -op Vital signs reviewed and stable  Post vital signs: Reviewed and stable  Last Vitals:  Vitals Value Taken Time  BP    Temp    Pulse    Resp    SpO2      Last Pain:  Vitals:   02/09/18 0746  TempSrc: Oral         Complications: No apparent anesthesia complications

## 2018-02-09 NOTE — Interval H&P Note (Signed)
History and Physical Interval Note:  02/09/2018 9:13 AM  Tracy Evans  has presented today for surgery, with the diagnosis of repeat C-Section  The various methods of treatment have been discussed with the patient and family. After consideration of risks, benefits and other options for treatment, the patient has consented to  Procedure(s) with comments: REPEAT CESAREAN SECTION (N/A) - [name],  RNFA as a surgical intervention .  The patient's history has been reviewed, patient examined, no change in status, stable for surgery.  I have reviewed the patient's chart and labs.  Questions were answered to the patient's satisfaction.     [name]

## 2018-02-09 NOTE — Lactation Note (Signed)
This note was copied from a baby's chart. Lactation Consultation Note  Patient Name: Tracy Evans IRWER'X Date: 02/09/2018   [facility] Initial Visit:  RN working with patient; will return later today for initial visit.     Cambren Helm R Jamyron Redd 02/09/2018, 5:10 PM

## 2018-02-09 NOTE — Anesthesia Procedure Notes (Signed)
Spinal  Start time: 02/09/2018 9:42 AM End time: 02/09/2018 9:44 AM Staffing Anesthesiologist: Shelton Silvas, MD Performed: anesthesiologist  Preanesthetic Checklist Completed: patient identified, site marked, surgical consent, pre-op evaluation, timeout performed, IV checked, risks and benefits discussed and monitors and equipment checked Spinal Block Patient position: sitting Prep: site prepped and draped and DuraPrep Location: L3-4 Injection technique: single-shot Needle Needle type: Pencan  Needle gauge: 24 G Needle length: 10 cm Needle insertion depth: 10 cm Additional Notes Patient tolerated well. No immediate complications.

## 2018-02-10 ENCOUNTER — Other Ambulatory Visit: Payer: Self-pay

## 2018-02-10 LAB — CBC
HCT: 33.8 % — ABNORMAL LOW (ref 36.0–46.0)
Hemoglobin: 11.2 g/dL — ABNORMAL LOW (ref 12.0–15.0)
MCH: 30.9 pg (ref 26.0–34.0)
MCHC: 33.1 g/dL (ref 30.0–36.0)
MCV: 93.4 fL (ref 80.0–100.0)
Platelets: 182 10*3/uL (ref 150–400)
RBC: 3.62 MIL/uL — ABNORMAL LOW (ref 3.87–5.11)
RDW: 12.4 % (ref 11.5–15.5)
WBC: 12.8 10*3/uL — ABNORMAL HIGH (ref 4.0–10.5)
nRBC: 0 % (ref 0.0–0.2)

## 2018-02-10 LAB — BIRTH TISSUE RECOVERY COLLECTION (PLACENTA DONATION)

## 2018-02-10 MED ORDER — ACETAMINOPHEN 325 MG PO TABS
650.0000 mg | ORAL_TABLET | ORAL | Status: DC | PRN
  Administered 2018-02-10 – 2018-02-11 (×2): 650 mg via ORAL
  Filled 2018-02-10 (×2): qty 2

## 2018-02-10 NOTE — Progress Notes (Signed)
Subjective: Postpartum Day 1: Cesarean Delivery Patient reports incisional pain, tolerating PO and no problems voiding.    Objective: Vital signs in last 24 hours: Temp:  [97.4 F (36.3 C)-98.9 F (37.2 C)] 98.2 F (36.8 C) (01/10 0524) Pulse Rate:  [67-84] 84 (01/10 0524) Resp:  [13-25] 18 (01/10 0524) BP: (108-132)/(60-84) 108/64 (01/10 0524) SpO2:  [95 %-100 %] 99 % (01/10 0524)  Physical Exam:  General: alert and cooperative Lochia: appropriate Uterine Fundus: firm Incision: C/D/I   Recent Labs    02/08/18 1013 02/10/18 0525  HGB 13.8 11.2*  HCT 40.7 33.8*    Assessment/Plan: Status post Cesarean section. Doing well postoperatively.  Continue current care. Baby feeding well.  Tracy Evans 02/10/2018, 8:26 AM

## 2018-02-10 NOTE — Op Note (Signed)
NAME: Tracy Evans, Tracy Evans MEDICAL RECORD [id number] ACCOUNT [id number] DATE OF BIRTH:05/20/1985 FACILITY: [facility] LOCATION: [id number] Marion Downer, MD  OPERATIVE REPORT  DATE OF PROCEDURE:  02/09/2018  PREOPERATIVE DIAGNOSIS:  Intrauterine pregnancy at term, desires repeat low transverse cesarean section, declines trial of labor after cesarean.  POSTOPERATIVE DIAGNOSIS:  Intrauterine pregnancy at term, desires repeat low transverse cesarean section, declines trial of labor after cesarean.  PROCEDURE:  Repeat low transverse cesarean section.  SURGEON:  Sherian Rein, MD  ASSISTANT:  Webb Silversmith, RNFA   ANESTHESIA:  Spinal.  INTRAVENOUS FLUIDS AND URINE OUTPUT:  Per anesthesia.  Clear urine at the end of the procedure.  ESTIMATED BLOOD LOSS:  Approximately 277 mL.  FINDINGS:  Viable female infant at 10:06 a.m. with Apgars of 8 at one minute and 9 at five minutes and the weight pending at the time of dictation.  Normal uterus, tubes and ovaries were noted.  COMPLICATIONS:  None.  PATHOLOGY:  Placenta to labor and delivery.  DESCRIPTION OF PROCEDURE:  After informed consent was reviewed with the patient including risks, benefits and alternatives of the surgical procedure, she was transported to the OR where spinal anesthesia was placed and found to be adequate.  She was then  returned to supine position with a leftward tilt, prepped and draped in the normal sterile fashion.  After an appropriate timeout, a Pfannenstiel skin incision was made at the level of her previous incision and carried through to the underlying layer of  fascia sharply.  The fascia was incised in the midline.  The incision was extended laterally with Mayo scissors.  The superior aspect of the fascial incision was grasped with Kocher clamps, elevated and the rectus muscles were dissected off both bluntly  and sharply.  The midline was easily identified, and the peritoneum was  entered bluntly.  This incision was extended superiorly and inferiorly with good visualization of the bladder.  An Alexis skin retractor was placed carefully, making sure that no  bowel was entrapped.  The uterus was explored.  The uterus was incised in transverse fashion.  The infant was delivered with the aid of a vacuum from a vertex presentation.  Nose and mouth were suctioned on the field.  The baby was dried, and after  waiting a minute, the cord was clamped and cut and the infant was handed off to the waiting pediatric staff.  The placenta was expressed from the uterus.  The uterus was cleared of all clot and debris.  The uterine incision was closed with 2 layers of 0  Monocryl, the first of which was running locked and the second as an imbricating layer.  The gutters were cleared of clot and debris.  The peritoneum was reapproximated using 2-0 Vicryl.  The subfascial layers were inspected and found to be hemostatic.   The fascia was then closed from either end, overlapping in the midline with 0 Vicryl in a running fashion.  Subcuticular adipose layer was made hemostatic with Bovie cautery, and the dead space was closed with plain gut.  The skin was closed with 4-0  Vicryl in a subcuticular fashion.  The patient tolerated the procedure well.  Sponge, lap and needle counts were correct x2 per the operating room staff.  LN/NUANCE  D:02/09/2018 T:02/09/2018 JOB:[id number]

## 2018-02-11 MED ORDER — IBUPROFEN 800 MG PO TABS: 800.0000 mg | ORAL_TABLET | Freq: Three times a day (TID) | ORAL | 0 refills | Status: DC

## 2018-02-11 MED ORDER — OXYCODONE HCL 5 MG PO TABS: 5.0000 mg | ORAL_TABLET | ORAL | 0 refills | Status: DC | PRN

## 2018-02-11 NOTE — Discharge Summary (Signed)
OB Discharge Summary     Patient Name: Tracy Evans DOB: 10/08/1985 MRN: [id number]  Date of admission: 02/09/2018 Delivering MD: Sherian ReinBOVARD-STUCKERT, JODY   Date of discharge: 02/11/2018  Admitting diagnosis: repeat C-Section Intrauterine pregnancy: 1686w3d     Secondary diagnosis:  Principal Problem:   H/O cesarean section complicating pregnancy Active Problems:   Status post repeat low transverse cesarean section  Additional problems: none     Discharge diagnosis: Term Pregnancy Delivered                                                                                                Post partum procedures:none    Complications: None  Hospital course:  Sceduled C/S   33 y.o. yo G2P2002 at 4486w3d was admitted to the hospital 02/09/2018 for scheduled cesarean section with the following indication:Elective Repeat.  Membrane Rupture Time/Date: 10:04 AM ,02/09/2018   Patient delivered a Viable infant.02/09/2018  Details of operation can be found in separate operative note.  Pateint had an uncomplicated postpartum course.  She is ambulating, tolerating a regular diet, passing flatus, and urinating well. Patient is discharged home in stable condition on  02/11/18         Physical exam  Vitals:   02/10/18 0524 02/10/18 1000 02/10/18 2200 02/11/18 0543  BP: 108/64 111/63 (!) 112/55 115/63  Pulse: 84 76 74 71  Resp: 18 20 18    Temp: 98.2 F (36.8 C) 98 F (36.7 C) 98 F (36.7 C) 97.9 F (36.6 C)  TempSrc: Oral     SpO2: 99% 98% 96%   Weight:      Height:       General: alert and cooperative Lochia: appropriate Uterine Fundus: firm Incision: Dressing is clean, dry, and intact  Labs: Lab Results  Component Value Date   WBC 12.8 (H) 02/10/2018   HGB 11.2 (L) 02/10/2018   HCT 33.8 (L) 02/10/2018   MCV 93.4 02/10/2018   PLT 182 02/10/2018   CMP Latest Ref Rng & Units 02/08/2018  Glucose 70 - 99 mg/dL 90  BUN 6 - 20 mg/dL 8  Creatinine 4.090.44 - 8.111.00 mg/dL 9.140.58  Sodium 782135 -  956145 mmol/L 133(L)  Potassium 3.5 - 5.1 mmol/L 4.1  Chloride 98 - 111 mmol/L 104  CO2 22 - 32 mmol/L 21(L)  Calcium 8.9 - 10.3 mg/dL 2.1(H8.8(L)  Total Protein 6.5 - 8.1 g/dL 7.0  Total Bilirubin 0.3 - 1.2 mg/dL 0.4  Alkaline Phos 38 - 126 U/L 99  AST 15 - 41 U/L 18  ALT 0 - 44 U/L 15    Discharge instruction: per After Visit Summary and "Baby and Me Booklet".  After visit meds:  Allergies as of 02/11/2018   No Known Allergies     Medication List    STOP taking these medications   doxylamine (Sleep) 25 MG tablet Commonly known as:  UNISOM   pyridOXINE 25 MG tablet Commonly known as:  VITAMIN B-6     TAKE these medications   acetaminophen 500 MG tablet Commonly known as:  TYLENOL Take 1,000 mg by mouth every 8 (eight)  hours as needed (pain).   docusate sodium 100 MG capsule Commonly known as:  COLACE Take 300 mg by mouth daily as needed for mild constipation.   esomeprazole 40 MG capsule Commonly known as:  NEXIUM Take 40 mg by mouth daily at 12 noon.   ibuprofen 800 MG tablet Commonly known as:  ADVIL,MOTRIN Take 1 tablet (800 mg total) by mouth every 8 (eight) hours.   oxyCODONE 5 MG immediate release tablet Commonly known as:  Oxy IR/ROXICODONE Take 1-2 tablets (5-10 mg total) by mouth every 4 (four) hours as needed for moderate pain.   PRENATAL 1 PO Take 1 tablet by mouth daily. [employer]       Diet: routine diet  Activity: Advance as tolerated. Pelvic rest for 6 weeks.   Outpatient follow up:2 weeks Follow up Appt:No future appointments. Follow up Visit:No follow-ups on file.  Postpartum contraception: Undecided  Newborn Data: Live born female  Birth Weight: 8 lb 13.6 oz (4015 g) APGAR: 8, 9  Newborn Delivery   Birth date/time:  02/09/2018 10:06:00 Delivery type:  C-Section, Low Transverse Trial of labor:  No C-section categorization:  Repeat     Baby Feeding: Breast Disposition:home with mother   02/11/2018 Oliver PilaKathy W Ruffus Kamaka,  MD

## 2018-02-11 NOTE — Progress Notes (Signed)
Subjective: [name]: Cesarean Delivery Patient reports incisional pain, tolerating PO and no problems voiding.    Objective: Vital signs in last 24 hours: Temp:  [97.9 F (36.6 C)-98 F (36.7 C)] 97.9 F (36.6 C) (01/11 0543) Pulse Rate:  [71-74] 71 (01/11 0543) Resp:  [18] 18 (01/10 2200) BP: (112-115)/(55-63) 115/63 (01/11 0543) SpO2:  [96 %] 96 % (01/10 2200)  Physical Exam:  General: alert and cooperative Lochia: appropriate Uterine Fundus: firm Incision: C/D/I   Recent Labs    02/10/18 0525  HGB 11.2*  HCT 33.8*    Assessment/Plan: Status post Cesarean section. Doing well postoperatively.  Discharge home with standard precautions and return to office in 2 weeks.  Tracy Evans 02/11/2018, 10:30 AM

## 2018-02-11 NOTE — Lactation Note (Signed)
This note was copied from a baby's chart. Lactation Consultation Note  Patient Name: Tracy Evans [id number] Date: 02/11/2018 Reason for consult: Follow-up assessment;Term Mom preparing for discharge.  She reports baby is feeding well and milk is in.  Mom is working on getting a deep latch and knows to take baby off and relatch if too shallow.  Breasts are comfortable.  Mom has a DEBP at home.  Reviewed engorgement treatment.  Lactation outpatient services and support information reviewed and encouraged.  Maternal Data    Feeding Feeding Type: Breast Fed  LATCH Score Latch: Grasps breast easily, tongue down, lips flanged, rhythmical sucking.  Audible Swallowing: Spontaneous and intermittent  Type of Nipple: Everted at rest and after stimulation  Comfort (Breast/Nipple): Soft / non-tender  Hold (Positioning): No assistance needed to correctly position infant at breast.  LATCH Score: 10  Interventions    Lactation Tools Discussed/Used     Consult Status Consult Status: Follow-up Date: 02/12/18 Follow-up type: In-patient    Huston Foley 02/11/2018, 11:33 AM

## 2018-03-01 ENCOUNTER — Ambulatory Visit: Payer: Self-pay

## 2018-03-01 DIAGNOSIS — R633 Feeding difficulties: Secondary | ICD-10-CM | POA: Diagnosis not present

## 2018-03-01 NOTE — Lactation Note (Signed)
This note was copied from a baby's chart. 03/01/2018  Name: Tracy Evans MRN: [id number] Date of Birth: 02/09/2020 Gestational Age: Gestational Age: [redacted]w[redacted]d Birth Weight: 141.6 oz Weight today:    10 pounds 10.1 ounces (4822 grams) with clean size 57 diaper  110 week old infant presents today with mom for feeding assessment. Mom is continuing to have nipple pain and wants to get it checked out. Mom denies any nipple breakdown.   Infant has gained 949 grams in the last 18 days with an average daily weight gain of 53 grams a day.   Mom reports nipple pain with feeding. Mom reports she eats very fast and chokes on the breast with most feedings. Nipple is compressed and asymmetrical post latch. Feeding is painful for mom.   Infant latched to the right breast in the cradle hold. She fed for about 10 minutes and transferred 66 ml. Mom reports some increased comfort today with flanging of the upper lip with feeding. Mom usually flanges lower lip after latch. Nipple was asymmetrical and slightly compressed post latch.   Infant with thick labial frenulum. Upper lip is tight with flanging and on the breast. Upper lip blanches with flanging. Infant with sucking blister to center upper lip. Infant with good tongue extension and lateralization. Infant with some decreased mid tongue elevation. Infant with hump to back of tongue when suckling on gloved finger and high palate. Discussed these are all normal variations of a mouth. Discussed how tongue and lip restrictions can effect milk supply and milk transfer over time. Discussed some pumping to protect milk supply as needed and watching infant growth over time. Mom reports infant is very gassy with a lot of stomach discomfort. Mom reports infant does not do well laying on her back after feeding and arches after feeding. mom keeps infant upright after feeding and burps her well. Infant does choke on the breast with letdowns. Mom reports her family has a  history of gaps between their upper teeth. Website information and local provider list given. Mom given information on CST in the area also.   Mom has overactive letdown and infant chokes on feeding. Enc mom not to try and down regulate supply until at least 6 weeks as it should improve on its own over time. Discussed prepumping a little bit before latching infant or feeding in laid back position to help infant. Mom reports she has trouble feeding in laid back position due to c/s. Mom reports she had very similar issues with her son and he gained very well also.    Infant to follow up with Dr. Early Osmond in 2 weeks. Mom is aware of BF Support Groups. Infant to follow up with Lactation as needed or 1-5 days   Mom is working on becoming a Biomedical scientist. She recently moved here from [facility]. Mom reports all questions have been answered. Mom to call with questions/concerns as needed.   General Information: Mother's reason for visit: Feeding assessment, sore nipples with feedings Consult: Initial Lactation consultant: Noralee Stain RN,IBCLC Breastfeeding experience: latches well and gaining well. nipples are sore with feedings, especially at night   Maternal medications: Pre-natal vitamin(baby on probiotics for increased gassiness)  Breastfeeding History: Frequency of breast feeding: every 2-4 hours on demand Duration of feeding: 5-15 minutes  Supplementation:                 Pump type: Medela pump in style(Haakaa) Pump frequency: none    Infant Output Assessment: Voids per 24  hours: 8-10 Urine color: Clear yellow Stools per 24 hours: 1-5 Stool color: Yellow  Breast Assessment: Breast: Soft, Compressible Nipple: Erect, Other(compressed and asymmetrical post feedings) Pain level: 7(7-8 at night with latch and decreases to 2-3 with feeding, less severe with daytime feedings) Pain interventions: Bra, Coconut oil  Feeding Assessment: Infant oral assessment: Variance Infant oral  assessment comment: see note Positioning: Cradle(right breast, 10 minutes) Latch: 2 - Grasps breast easily, tongue down, lips flanged, rhythmical sucking. Audible swallowing: 2 - Spontaneous and intermittent Type of nipple: 2 - Everted at rest and after stimulation Comfort: 1 - Filling, red/small blisters or bruises, mild/mod discomfort Hold: 1 - Assistance needed to correctly position infant at breast and maintain latch LATCH score: 8 Latch assessment: Deep Lips flanged: No(upper lip needs flanging) Suck assessment: Displays both   Pre-feed weight: 4822 grams Post feed weight: 4888 grams Amount transferred: 66 ml Amount supplemented: 0  Additional Feeding Assessment:                                    Totals: Total amount transferred: 66 ml Total supplement given: 0 Total amount pumped post feed: did not pump   Plan:  1. Offer infant the breast with feeding cues 2. Flange upper and lower lips after latch 3. Keep infant upright for about 20 minutes after feeding 4. Empty the first breast before offering the second breast  5. May help to pump off 1/2 ounce before latching infant or feed infant in the laid back position to see if it helps with choking 6. Kathryne Eriksson is a body work Barrister's clerk [phone] 8. Keep up the good work 8. Thank you for allowing me to assist you today 9. Please call with any questions/concerns as needed [phone] 10. Follow up with Lactation as needed or 1-5 days post tongue and lip releases if completed.   Silas Flood Cherie Lasalle RN, IBCLC                                                     Silas Flood Rose Hippler 03/01/2018, 3:40 PM

## 2018-03-13 ENCOUNTER — Ambulatory Visit: Payer: Self-pay

## 2018-03-13 NOTE — Lactation Note (Signed)
This note was copied from a baby's chart. Lactation Consultation Note  Patient Name: Tracy Evans Today's Date: 03/13/2018     03/13/2018  Name: Tracy Evans MRN: [id number] Date of Birth: 02/09/2020 Gestational Age: Gestational Age: [redacted]w[redacted]d Birth Weight: 141.6 oz Weight today:    11 pounds 5.2 ounces (5136 grams) with clean size 1 diaper    Infant presents today with mom for follow up feeding assessment. Infant post tongue and lip releases on 2/6 by Dr. Orland Mustard.   Infant has gained 314 grams in the last 12 days with an average daily weight gain of 26 grams a day.   Mom reports infant tolerated procedure well. Mom reports infant is latching better, no further clicking on the breast, less leakage of milk with feeding, less pain with feeding for mom and less gassy since releases. Mom reports no need for pain medicine for infant.   Mom performing tongue and lip stretches per Dr. Orland Mustard. Mom shown suck training exercises to perform 5-6 x a day for 1-2 minutes per exercise for 2-3 weeks. Mom given handout. Discussed with mom it can take 2-4 weeks to see the full benefit on tongue and lip releases, mom voiced understanding.   Mom with history of yeast with her son. She started APNO 2 days ago as she started to feel the yeast symptoms return. Mom reports she feels better today. Infant with no signs of yeast. Mom given handout on yeast tx for mom and infant. Mom is washing bras daily. Enc mom to boil pacifiers daily also. Infant is not receiving bottles. Mom informed milk is not killed by freezing, mom aware. Discussed Dr. Karleen Hampshire in [place] if Yeast becomes a problem for mom. Enc mom to continue treatment for 1-2 weeks after symptoms are gone. Mom denies shooting pains in the breast.   Infant fed well at the breast. She did chole once with the feeding. Nipple was more rounded post feeding. Mom reports infant latches better during the day than at night.   Infant to follow up  with Dr. Orland Mustard 2/19. Infant to follow up with Dr. Early Osmond tomorrow. Infant to follow up with Lactation as needed.   Mom to call with questions/concerns as needed.    General Information: Mother's reason for visit: Follow up feeding assessment, post tongue and lip releases 2/6 by Dr. Orland Mustard Consult: Follow-up Lactation consultant: Noralee Stain RN,IBCLC Breastfeeding experience: feedings have improved. 0 clicking, less drooling at the breast, less pain with latch.    Maternal medications: Pre-natal vitamin, Other(APNO)  Breastfeeding History: Frequency of breast feeding: every 2-4 hours Duration of feeding: 5-10 minutes  Supplementation:                 Pump type: Other(Haakaa) Pump frequency: haakaa 2-3 feeds a day, discarding due to yeast symptoms Pump volume: 1 ounce  Infant Output Assessment: Voids per 24 hours: 6-10 Urine color: Clear yellow Stools per 24 hours: one every 2-3 days Stool color: Yellow  Breast Assessment: Breast: Soft, Compressible Nipple: Erect Pain level: 3 Pain interventions: All purpose nipple cream, Bra  Feeding Assessment: Infant oral assessment: Variance Infant oral assessment comment: Infant with granulation tissue to upper lip, lip still needs flanging on the breast. Infant with diamond shaped granulation tissue under her tongue. infant with better tongue elevation today. BF has improved for mom and infant.  Positioning: Cradle(right breast, 10 minutes) Latch: 2 - Grasps breast easily, tongue down, lips flanged, rhythmical sucking. Audible swallowing: 2 - Spontaneous and intermittent  Type of nipple: 2 - Everted at rest and after stimulation Comfort: 1 - Filling, red/small blisters or bruises, mild/mod discomfort Hold: 2 - No assistance needed to correctly position infant at breast LATCH score: 9 Latch assessment: Deep Lips flanged: No(upper lip needs flanging) Suck assessment: Displays both   Pre-feed weight: 5136 grams Post  feed weight: 5194 grams Amount transferred: 58 ml Amount supplemented: 0  Additional Feeding Assessment:                                    Totals: Total amount transferred: 58 ml Total supplement given: 0 Total amount pumped post feed: did not pump  1. Offer infant the breast with feeding cues 2. Flange upper and lower lips after latch 3. Keep infant upright for about 20 minutes after feeding 4. Empty the first breast before offering the second breast  5. May help to pump off 1/2 ounce before latching infant or feed infant in the laid back position to see if it helps with choking 6. Kathryne Erikssonrin Balkind is a body work Barrister's clerkspecialist [phone] 7. Continue stretches per Dr. Orland MustardMcMurtry 8. Suck training exercises 5-6 times a day for 1-2 minutes per exercise for 2-3 weeks until tongue and lip are healed 9. Keep up the good work 10. Thank you for allowing me to assist you today 11. Please call with any questions/concerns as needed [phone] 12. Follow up with Lactation as needed   [facility] Consultant RN, IBCLC                                                     Silas FloodSharon S  03/13/2018, 3:48 PM

## 2018-03-20 DIAGNOSIS — M9903 Segmental and somatic dysfunction of lumbar region: Secondary | ICD-10-CM | POA: Diagnosis not present

## 2018-03-20 DIAGNOSIS — M9902 Segmental and somatic dysfunction of thoracic region: Secondary | ICD-10-CM | POA: Diagnosis not present

## 2018-03-20 DIAGNOSIS — M546 Pain in thoracic spine: Secondary | ICD-10-CM | POA: Diagnosis not present

## 2018-03-20 DIAGNOSIS — M9901 Segmental and somatic dysfunction of cervical region: Secondary | ICD-10-CM | POA: Diagnosis not present

## 2018-03-28 DIAGNOSIS — M9903 Segmental and somatic dysfunction of lumbar region: Secondary | ICD-10-CM | POA: Diagnosis not present

## 2018-03-28 DIAGNOSIS — M9901 Segmental and somatic dysfunction of cervical region: Secondary | ICD-10-CM | POA: Diagnosis not present

## 2018-03-28 DIAGNOSIS — M546 Pain in thoracic spine: Secondary | ICD-10-CM | POA: Diagnosis not present

## 2018-03-28 DIAGNOSIS — M9902 Segmental and somatic dysfunction of thoracic region: Secondary | ICD-10-CM | POA: Diagnosis not present

## 2018-03-29 DIAGNOSIS — Z3009 Encounter for other general counseling and advice on contraception: Secondary | ICD-10-CM | POA: Diagnosis not present

## 2018-03-29 DIAGNOSIS — Z1389 Encounter for screening for other disorder: Secondary | ICD-10-CM | POA: Diagnosis not present

## 2018-04-03 DIAGNOSIS — M546 Pain in thoracic spine: Secondary | ICD-10-CM | POA: Diagnosis not present

## 2018-04-03 DIAGNOSIS — M9902 Segmental and somatic dysfunction of thoracic region: Secondary | ICD-10-CM | POA: Diagnosis not present

## 2018-04-03 DIAGNOSIS — M9903 Segmental and somatic dysfunction of lumbar region: Secondary | ICD-10-CM | POA: Diagnosis not present

## 2018-04-03 DIAGNOSIS — M9901 Segmental and somatic dysfunction of cervical region: Secondary | ICD-10-CM | POA: Diagnosis not present

## 2018-04-10 DIAGNOSIS — M9903 Segmental and somatic dysfunction of lumbar region: Secondary | ICD-10-CM | POA: Diagnosis not present

## 2018-04-10 DIAGNOSIS — M9902 Segmental and somatic dysfunction of thoracic region: Secondary | ICD-10-CM | POA: Diagnosis not present

## 2018-04-10 DIAGNOSIS — M546 Pain in thoracic spine: Secondary | ICD-10-CM | POA: Diagnosis not present

## 2018-04-10 DIAGNOSIS — M9901 Segmental and somatic dysfunction of cervical region: Secondary | ICD-10-CM | POA: Diagnosis not present

## 2018-04-17 DIAGNOSIS — M9903 Segmental and somatic dysfunction of lumbar region: Secondary | ICD-10-CM | POA: Diagnosis not present

## 2018-04-17 DIAGNOSIS — M9901 Segmental and somatic dysfunction of cervical region: Secondary | ICD-10-CM | POA: Diagnosis not present

## 2018-04-17 DIAGNOSIS — M546 Pain in thoracic spine: Secondary | ICD-10-CM | POA: Diagnosis not present

## 2018-04-17 DIAGNOSIS — M9902 Segmental and somatic dysfunction of thoracic region: Secondary | ICD-10-CM | POA: Diagnosis not present

## 2018-04-24 DIAGNOSIS — M9902 Segmental and somatic dysfunction of thoracic region: Secondary | ICD-10-CM | POA: Diagnosis not present

## 2018-04-24 DIAGNOSIS — M9903 Segmental and somatic dysfunction of lumbar region: Secondary | ICD-10-CM | POA: Diagnosis not present

## 2018-04-24 DIAGNOSIS — M9901 Segmental and somatic dysfunction of cervical region: Secondary | ICD-10-CM | POA: Diagnosis not present

## 2018-04-24 DIAGNOSIS — M546 Pain in thoracic spine: Secondary | ICD-10-CM | POA: Diagnosis not present

## 2018-05-11 DIAGNOSIS — N644 Mastodynia: Secondary | ICD-10-CM | POA: Diagnosis not present

## 2018-05-18 DIAGNOSIS — M9903 Segmental and somatic dysfunction of lumbar region: Secondary | ICD-10-CM | POA: Diagnosis not present

## 2018-05-18 DIAGNOSIS — M546 Pain in thoracic spine: Secondary | ICD-10-CM | POA: Diagnosis not present

## 2018-05-18 DIAGNOSIS — M9901 Segmental and somatic dysfunction of cervical region: Secondary | ICD-10-CM | POA: Diagnosis not present

## 2018-05-18 DIAGNOSIS — M9902 Segmental and somatic dysfunction of thoracic region: Secondary | ICD-10-CM | POA: Diagnosis not present

## 2018-06-01 DIAGNOSIS — M9901 Segmental and somatic dysfunction of cervical region: Secondary | ICD-10-CM | POA: Diagnosis not present

## 2018-06-01 DIAGNOSIS — M9903 Segmental and somatic dysfunction of lumbar region: Secondary | ICD-10-CM | POA: Diagnosis not present

## 2018-06-01 DIAGNOSIS — M546 Pain in thoracic spine: Secondary | ICD-10-CM | POA: Diagnosis not present

## 2018-06-01 DIAGNOSIS — M9902 Segmental and somatic dysfunction of thoracic region: Secondary | ICD-10-CM | POA: Diagnosis not present

## 2018-06-15 DIAGNOSIS — M9901 Segmental and somatic dysfunction of cervical region: Secondary | ICD-10-CM | POA: Diagnosis not present

## 2018-06-15 DIAGNOSIS — M9903 Segmental and somatic dysfunction of lumbar region: Secondary | ICD-10-CM | POA: Diagnosis not present

## 2018-06-15 DIAGNOSIS — M9902 Segmental and somatic dysfunction of thoracic region: Secondary | ICD-10-CM | POA: Diagnosis not present

## 2018-06-15 DIAGNOSIS — M546 Pain in thoracic spine: Secondary | ICD-10-CM | POA: Diagnosis not present

## 2018-06-22 DIAGNOSIS — N6320 Unspecified lump in the left breast, unspecified quadrant: Secondary | ICD-10-CM | POA: Diagnosis not present

## 2018-07-03 DIAGNOSIS — M546 Pain in thoracic spine: Secondary | ICD-10-CM | POA: Diagnosis not present

## 2018-07-03 DIAGNOSIS — M9901 Segmental and somatic dysfunction of cervical region: Secondary | ICD-10-CM | POA: Diagnosis not present

## 2018-07-03 DIAGNOSIS — M9903 Segmental and somatic dysfunction of lumbar region: Secondary | ICD-10-CM | POA: Diagnosis not present

## 2018-07-03 DIAGNOSIS — M9902 Segmental and somatic dysfunction of thoracic region: Secondary | ICD-10-CM | POA: Diagnosis not present

## 2018-07-04 DIAGNOSIS — M9901 Segmental and somatic dysfunction of cervical region: Secondary | ICD-10-CM | POA: Diagnosis not present

## 2018-07-04 DIAGNOSIS — M9903 Segmental and somatic dysfunction of lumbar region: Secondary | ICD-10-CM | POA: Diagnosis not present

## 2018-07-04 DIAGNOSIS — M546 Pain in thoracic spine: Secondary | ICD-10-CM | POA: Diagnosis not present

## 2018-07-04 DIAGNOSIS — M9902 Segmental and somatic dysfunction of thoracic region: Secondary | ICD-10-CM | POA: Diagnosis not present

## 2018-07-06 DIAGNOSIS — M546 Pain in thoracic spine: Secondary | ICD-10-CM | POA: Diagnosis not present

## 2018-07-06 DIAGNOSIS — M9901 Segmental and somatic dysfunction of cervical region: Secondary | ICD-10-CM | POA: Diagnosis not present

## 2018-07-06 DIAGNOSIS — M9902 Segmental and somatic dysfunction of thoracic region: Secondary | ICD-10-CM | POA: Diagnosis not present

## 2018-07-06 DIAGNOSIS — M9903 Segmental and somatic dysfunction of lumbar region: Secondary | ICD-10-CM | POA: Diagnosis not present

## 2018-07-10 DIAGNOSIS — M9903 Segmental and somatic dysfunction of lumbar region: Secondary | ICD-10-CM | POA: Diagnosis not present

## 2018-07-10 DIAGNOSIS — M546 Pain in thoracic spine: Secondary | ICD-10-CM | POA: Diagnosis not present

## 2018-07-10 DIAGNOSIS — M9901 Segmental and somatic dysfunction of cervical region: Secondary | ICD-10-CM | POA: Diagnosis not present

## 2018-07-10 DIAGNOSIS — M9902 Segmental and somatic dysfunction of thoracic region: Secondary | ICD-10-CM | POA: Diagnosis not present

## 2018-07-11 DIAGNOSIS — M546 Pain in thoracic spine: Secondary | ICD-10-CM | POA: Diagnosis not present

## 2018-07-11 DIAGNOSIS — M9903 Segmental and somatic dysfunction of lumbar region: Secondary | ICD-10-CM | POA: Diagnosis not present

## 2018-07-11 DIAGNOSIS — M9902 Segmental and somatic dysfunction of thoracic region: Secondary | ICD-10-CM | POA: Diagnosis not present

## 2018-07-11 DIAGNOSIS — M9901 Segmental and somatic dysfunction of cervical region: Secondary | ICD-10-CM | POA: Diagnosis not present

## 2018-07-13 DIAGNOSIS — M9903 Segmental and somatic dysfunction of lumbar region: Secondary | ICD-10-CM | POA: Diagnosis not present

## 2018-07-13 DIAGNOSIS — M546 Pain in thoracic spine: Secondary | ICD-10-CM | POA: Diagnosis not present

## 2018-07-13 DIAGNOSIS — M9902 Segmental and somatic dysfunction of thoracic region: Secondary | ICD-10-CM | POA: Diagnosis not present

## 2018-07-13 DIAGNOSIS — M9901 Segmental and somatic dysfunction of cervical region: Secondary | ICD-10-CM | POA: Diagnosis not present

## 2018-07-17 DIAGNOSIS — M546 Pain in thoracic spine: Secondary | ICD-10-CM | POA: Diagnosis not present

## 2018-07-17 DIAGNOSIS — M9902 Segmental and somatic dysfunction of thoracic region: Secondary | ICD-10-CM | POA: Diagnosis not present

## 2018-07-17 DIAGNOSIS — M9901 Segmental and somatic dysfunction of cervical region: Secondary | ICD-10-CM | POA: Diagnosis not present

## 2018-07-17 DIAGNOSIS — M9903 Segmental and somatic dysfunction of lumbar region: Secondary | ICD-10-CM | POA: Diagnosis not present

## 2018-07-19 DIAGNOSIS — M546 Pain in thoracic spine: Secondary | ICD-10-CM | POA: Diagnosis not present

## 2018-07-19 DIAGNOSIS — M9902 Segmental and somatic dysfunction of thoracic region: Secondary | ICD-10-CM | POA: Diagnosis not present

## 2018-07-19 DIAGNOSIS — M9901 Segmental and somatic dysfunction of cervical region: Secondary | ICD-10-CM | POA: Diagnosis not present

## 2018-07-19 DIAGNOSIS — M9903 Segmental and somatic dysfunction of lumbar region: Secondary | ICD-10-CM | POA: Diagnosis not present

## 2018-09-06 DIAGNOSIS — Z20828 Contact with and (suspected) exposure to other viral communicable diseases: Secondary | ICD-10-CM | POA: Diagnosis not present

## 2018-09-16 DIAGNOSIS — Z20828 Contact with and (suspected) exposure to other viral communicable diseases: Secondary | ICD-10-CM | POA: Diagnosis not present

## 2018-09-18 DIAGNOSIS — N911 Secondary amenorrhea: Secondary | ICD-10-CM | POA: Diagnosis not present

## 2018-09-18 DIAGNOSIS — Z3201 Encounter for pregnancy test, result positive: Secondary | ICD-10-CM | POA: Diagnosis not present

## 2018-09-19 DIAGNOSIS — M546 Pain in thoracic spine: Secondary | ICD-10-CM | POA: Diagnosis not present

## 2018-09-19 DIAGNOSIS — M545 Low back pain: Secondary | ICD-10-CM | POA: Diagnosis not present

## 2018-09-28 DIAGNOSIS — M546 Pain in thoracic spine: Secondary | ICD-10-CM | POA: Diagnosis not present

## 2018-09-28 DIAGNOSIS — M545 Low back pain: Secondary | ICD-10-CM | POA: Diagnosis not present

## 2018-10-05 DIAGNOSIS — M546 Pain in thoracic spine: Secondary | ICD-10-CM | POA: Diagnosis not present

## 2018-10-05 DIAGNOSIS — M545 Low back pain: Secondary | ICD-10-CM | POA: Diagnosis not present

## 2018-10-19 DIAGNOSIS — Z3A10 10 weeks gestation of pregnancy: Secondary | ICD-10-CM | POA: Diagnosis not present

## 2018-10-19 DIAGNOSIS — O26891 Other specified pregnancy related conditions, first trimester: Secondary | ICD-10-CM | POA: Diagnosis not present

## 2018-10-19 DIAGNOSIS — Z113 Encounter for screening for infections with a predominantly sexual mode of transmission: Secondary | ICD-10-CM | POA: Diagnosis not present

## 2018-10-19 DIAGNOSIS — Z3481 Encounter for supervision of other normal pregnancy, first trimester: Secondary | ICD-10-CM | POA: Diagnosis not present

## 2018-10-19 DIAGNOSIS — Z3689 Encounter for other specified antenatal screening: Secondary | ICD-10-CM | POA: Diagnosis not present

## 2018-10-19 LAB — OB RESULTS CONSOLE GC/CHLAMYDIA
Chlamydia: NEGATIVE
Gonorrhea: NEGATIVE

## 2018-10-19 LAB — OB RESULTS CONSOLE ABO/RH: RH Type: POSITIVE

## 2018-10-19 LAB — OB RESULTS CONSOLE HEPATITIS B SURFACE ANTIGEN: Hepatitis B Surface Ag: NEGATIVE

## 2018-10-19 LAB — OB RESULTS CONSOLE RPR: RPR: NONREACTIVE

## 2018-10-19 LAB — OB RESULTS CONSOLE RUBELLA ANTIBODY, IGM: Rubella: IMMUNE

## 2018-10-19 LAB — OB RESULTS CONSOLE HIV ANTIBODY (ROUTINE TESTING): HIV: NONREACTIVE

## 2018-10-19 LAB — OB RESULTS CONSOLE ANTIBODY SCREEN: Antibody Screen: NEGATIVE

## 2018-11-02 DIAGNOSIS — M545 Low back pain: Secondary | ICD-10-CM | POA: Diagnosis not present

## 2018-11-02 DIAGNOSIS — M546 Pain in thoracic spine: Secondary | ICD-10-CM | POA: Diagnosis not present

## 2018-11-08 DIAGNOSIS — M545 Low back pain: Secondary | ICD-10-CM | POA: Diagnosis not present

## 2018-11-08 DIAGNOSIS — M546 Pain in thoracic spine: Secondary | ICD-10-CM | POA: Diagnosis not present

## 2018-11-16 DIAGNOSIS — M546 Pain in thoracic spine: Secondary | ICD-10-CM | POA: Diagnosis not present

## 2018-11-16 DIAGNOSIS — M545 Low back pain: Secondary | ICD-10-CM | POA: Diagnosis not present

## 2018-11-17 DIAGNOSIS — Z3A14 14 weeks gestation of pregnancy: Secondary | ICD-10-CM | POA: Diagnosis not present

## 2018-11-17 DIAGNOSIS — Z23 Encounter for immunization: Secondary | ICD-10-CM | POA: Diagnosis not present

## 2018-12-15 DIAGNOSIS — Z3A18 18 weeks gestation of pregnancy: Secondary | ICD-10-CM | POA: Diagnosis not present

## 2018-12-15 DIAGNOSIS — O09512 Supervision of elderly primigravida, second trimester: Secondary | ICD-10-CM | POA: Diagnosis not present

## 2018-12-15 DIAGNOSIS — Z363 Encounter for antenatal screening for malformations: Secondary | ICD-10-CM | POA: Diagnosis not present

## 2019-01-12 ENCOUNTER — Other Ambulatory Visit (HOSPITAL_COMMUNITY): Payer: Self-pay | Admitting: Obstetrics and Gynecology

## 2019-01-12 DIAGNOSIS — N200 Calculus of kidney: Secondary | ICD-10-CM

## 2019-01-12 DIAGNOSIS — Z3A22 22 weeks gestation of pregnancy: Secondary | ICD-10-CM | POA: Diagnosis not present

## 2019-01-12 DIAGNOSIS — Z362 Encounter for other antenatal screening follow-up: Secondary | ICD-10-CM | POA: Diagnosis not present

## 2019-01-15 ENCOUNTER — Ambulatory Visit (HOSPITAL_COMMUNITY)
Admission: RE | Admit: 2019-01-15 | Discharge: 2019-01-15 | Disposition: A | Discharge status: Home / Self Care | Payer: BC Managed Care – PPO | Source: Ambulatory Visit | Attending: Obstetrics and Gynecology | Admitting: Obstetrics and Gynecology

## 2019-01-15 ENCOUNTER — Other Ambulatory Visit: Payer: Self-pay

## 2019-01-15 DIAGNOSIS — N200 Calculus of kidney: Secondary | ICD-10-CM | POA: Diagnosis not present

## 2019-01-15 DIAGNOSIS — N132 Hydronephrosis with renal and ureteral calculous obstruction: Secondary | ICD-10-CM | POA: Diagnosis not present

## 2019-01-16 DIAGNOSIS — Z20828 Contact with and (suspected) exposure to other viral communicable diseases: Secondary | ICD-10-CM | POA: Diagnosis not present

## 2019-02-23 DIAGNOSIS — Z23 Encounter for immunization: Secondary | ICD-10-CM | POA: Diagnosis not present

## 2019-02-23 DIAGNOSIS — Z3689 Encounter for other specified antenatal screening: Secondary | ICD-10-CM | POA: Diagnosis not present

## 2019-04-19 DIAGNOSIS — Z3685 Encounter for antenatal screening for Streptococcus B: Secondary | ICD-10-CM | POA: Diagnosis not present

## 2019-04-24 ENCOUNTER — Encounter (HOSPITAL_COMMUNITY): Payer: Self-pay | Admitting: *Deleted

## 2019-04-24 ENCOUNTER — Telehealth (HOSPITAL_COMMUNITY): Payer: Self-pay | Admitting: *Deleted

## 2019-04-24 NOTE — Patient Instructions (Signed)
Tracy Evans  04/24/2019   Your procedure is scheduled on:  05/07/2019  Arrive at 0530 at [employer] [facility] on [address] at [facility]  and [employer]. You are invited to use the FREE valet parking or use the Visitor's parking deck.  Pick up the phone at the desk and dial [phone].  Call this number if you have problems the morning of surgery: [phone]  Remember:   Do not eat food:(After Midnight) Desps de medianoche.  Do not drink clear liquids: (After Midnight) Desps de medianoche.  Take these medicines the morning of surgery with A SIP OF WATER:  none   Do not wear jewelry, make-up or nail polish.  Do not wear lotions, powders, or perfumes. Do not wear deodorant.  Do not shave 48 hours prior to surgery.  Do not bring valuables to the hospital.  [facility] is not   responsible for any belongings or valuables brought to the hospital.  Contacts, dentures or bridgework may not be worn into surgery.  Leave suitcase in the car. After surgery it may be brought to your room.  For patients admitted to the hospital, checkout time is 11:00 AM the day of              discharge.      Please read over the following fact sheets that you were given:     Preparing for Surgery

## 2019-04-24 NOTE — Telephone Encounter (Signed)
Preadmission screen  

## 2019-04-25 ENCOUNTER — Telehealth (HOSPITAL_COMMUNITY): Payer: Self-pay | Admitting: *Deleted

## 2019-04-25 NOTE — Telephone Encounter (Signed)
Preadmission screen  

## 2019-04-26 ENCOUNTER — Encounter (HOSPITAL_COMMUNITY): Payer: Self-pay

## 2019-05-05 ENCOUNTER — Encounter (HOSPITAL_COMMUNITY): Payer: Self-pay | Admitting: Obstetrics and Gynecology

## 2019-05-05 ENCOUNTER — Other Ambulatory Visit (HOSPITAL_COMMUNITY)
Admission: RE | Admit: 2019-05-05 | Discharge: 2019-05-05 | Disposition: A | Discharge status: Home / Self Care | Payer: BC Managed Care – PPO | Source: Ambulatory Visit | Attending: Obstetrics and Gynecology | Admitting: Obstetrics and Gynecology

## 2019-05-05 ENCOUNTER — Other Ambulatory Visit: Payer: Self-pay

## 2019-05-05 DIAGNOSIS — Z01812 Encounter for preprocedural laboratory examination: Secondary | ICD-10-CM | POA: Diagnosis not present

## 2019-05-05 DIAGNOSIS — Z20822 Contact with and (suspected) exposure to covid-19: Secondary | ICD-10-CM | POA: Diagnosis not present

## 2019-05-05 LAB — ABO/RH: ABO/RH(D): AB POS

## 2019-05-05 LAB — CBC
HCT: 41.3 % (ref 36.0–46.0)
Hemoglobin: 13.9 g/dL (ref 12.0–15.0)
MCH: 31.1 pg (ref 26.0–34.0)
MCHC: 33.7 g/dL (ref 30.0–36.0)
MCV: 92.4 fL (ref 80.0–100.0)
Platelets: 221 10*3/uL (ref 150–400)
RBC: 4.47 MIL/uL (ref 3.87–5.11)
RDW: 12.2 % (ref 11.5–15.5)
WBC: 10.2 10*3/uL (ref 4.0–10.5)
nRBC: 0 % (ref 0.0–0.2)

## 2019-05-05 LAB — TYPE AND SCREEN
ABO/RH(D): AB POS
Antibody Screen: NEGATIVE

## 2019-05-05 LAB — SARS CORONAVIRUS 2 (TAT 6-24 HRS): SARS Coronavirus 2: NEGATIVE

## 2019-05-05 NOTE — MAU Note (Signed)
Pt here for covid swab and lab draw. Denies symptoms and sick contacts. Swab collected. 

## 2019-05-05 NOTE — Anesthesia Preprocedure Evaluation (Addendum)
Anesthesia Evaluation  Patient identified by MRN, date of birth, ID band Patient awake    Reviewed: Allergy & Precautions, NPO status , Patient's Chart, lab work & pertinent test results  Airway Mallampati: II  TM Distance: >3 FB Neck ROM: Full    Dental no notable dental hx. (+) Teeth Intact   Pulmonary neg pulmonary ROS,    Pulmonary exam normal breath sounds clear to auscultation       Cardiovascular negative cardio ROS Normal cardiovascular exam Rhythm:Regular Rate:Normal     Neuro/Psych  Headaches, negative psych ROS   GI/Hepatic Neg liver ROS, GERD  Medicated and Controlled,  Endo/Other  Obesity  Renal/GU negative Renal ROS  negative genitourinary   Musculoskeletal negative musculoskeletal ROS (+)   Abdominal (+) + obese,   Peds  Hematology negative hematology ROS (+)   Anesthesia Other Findings   Reproductive/Obstetrics (+) Pregnancy Hx/ previous C/Section                            Anesthesia Physical Anesthesia Plan  ASA: II  Anesthesia Plan: Spinal   Post-op Pain Management:    Induction:   PONV Risk Score and Plan: 4 or greater and Ondansetron, Treatment may vary due to age or medical condition and Dexamethasone  Airway Management Planned: Natural Airway  Additional Equipment:   Intra-op Plan:   Post-operative Plan:   Informed Consent: I have reviewed the patients History and Physical, chart, labs and discussed the procedure including the risks, benefits and alternatives for the proposed anesthesia with the patient or authorized representative who has indicated his/her understanding and acceptance.     Dental advisory given  Plan Discussed with: CRNA and Surgeon  Anesthesia Plan Comments:        Anesthesia Quick Evaluation

## 2019-05-06 LAB — RPR: RPR Ser Ql: NONREACTIVE

## 2019-05-06 NOTE — H&P (Signed)
Tracy Evans is a 34 y.o. female G3P2002  At 39+ for rLTCS - d/w pt r/b/a will proceed.  Pt w relatively uncomplicated PNC - however pt is AMA - declined genetic screening, has h/o migraine (improves in pregnancy), had had LTCS x 2, and has GERD.  Received Tdap and Flu vaccines in pregnancy.  Pregnancy dated by LMP c/w early [place].    OB History    Gravida  3   Para  2   Term  2   Preterm      AB      Living  2     SAB      TAB      Ectopic      Multiple  0   Live Births  2         G1 Tracy Evans 7#3 1/18 LTCS fetal stress/nuchal cord female Tracy Evans 1/20 rLTCS, female G3 present  No abn pap last 1/18 No STD  Past Medical History:  Diagnosis Date  . GERD (gastroesophageal reflux disease)   . H/O cesarean section complicating pregnancy 02/06/2018  . Headache    Past Surgical History:  Procedure Laterality Date  . APPENDECTOMY    . CESAREAN SECTION    . CESAREAN SECTION N/A 02/09/2018   Procedure: REPEAT CESAREAN SECTION;  Surgeon: Sherian Rein, MD;  Location: [facility];  Service: Obstetrics;  Laterality: N/A;  Tracy Evans,  RNFA   Family History: family history includes Cancer in her maternal grandmother and paternal grandmother; Healthy in her father and mother. asthma, breast cancer, CVA, substance abuse, kidney stone Social History:  reports that she has never smoked. She has never used smokeless tobacco. She reports previous alcohol use. She reports that she does not use drugs. married, SAHM  Meds Nexium, PNV All NKDA     Maternal Diabetes: No Genetic Screening: Declined Maternal Ultrasounds/Referrals: Normal Fetal Ultrasounds or other Referrals:  None Maternal Substance Abuse:  No Significant Maternal Medications:  None Significant Maternal Lab Results:  Group B Strep negative Other Comments:  None  Review of Systems  Constitutional: Negative.   HENT: Negative.   Eyes: Negative.   Respiratory: Negative.   Cardiovascular: Negative.    Gastrointestinal: Negative.   Genitourinary: Negative.   Musculoskeletal: Negative.   Skin: Negative.   Neurological: Negative.   Psychiatric/Behavioral: Negative.    Maternal Medical History:  Contractions: Frequency: irregular.    Fetal activity: Perceived fetal activity is normal.    Prenatal Complications - Diabetes: none.      Last menstrual period 08/06/2018, currently breastfeeding. Maternal Exam:  Uterine Assessment: Contraction strength is mild.  Contraction frequency is irregular.   Abdomen: Patient reports no abdominal tenderness. Surgical scars: low transverse.   Fundal height is appropriate for gestation.   Estimated fetal weight is 8-9#.   Fetal presentation: vertex  Introitus: Normal vulva. Normal vagina.    Physical Exam  Constitutional: She is oriented to person, place, and time. She appears well-developed and well-nourished.  HENT:  Head: Normocephalic and atraumatic.  Cardiovascular: Normal rate and regular rhythm.  Respiratory: Effort normal and breath sounds normal. No respiratory distress. She has no wheezes.  GI: Soft. Bowel sounds are normal. She exhibits no distension.  Genitourinary:    Vulva normal.   Musculoskeletal:        General: Normal range of motion.  Neurological: She is alert and oriented to person, place, and time.  Skin: Skin is warm.  Psychiatric: She has a normal mood and affect.  Prenatal labs: ABO, Rh: --/--/AB POS, AB POS Performed at [facility], [place] [address], [place],  [address]  [id number]) Antibody: NEG (04/03 0957) Rubella: Immune (09/17 0000) RPR: Nonreactive (09/17 0000)  HBsAg: Negative (09/17 0000)  HIV: Non-reactive (09/17 0000)  GBS:   negative  Dated by LMP Declined genetic screening Tdap an flu in [facility]  Hgb 14.0/Plt 326/Ur Cx neg/ GC neg/Cgl neg/Varicella immune/Hgb electro WNL/ glucola 106/ Renal US poss L stone [place] - nl anat, limited heart, female,  Post plac F/u completes  anat  Assessment/Plan: 82ZY [id number] for rLTCS Ancef for surgical prophylaxis D/w pt r/b/a of rLTCS - will proceed   Tracy Evans 05/06/2019, 10:20 AM

## 2019-05-07 ENCOUNTER — Inpatient Hospital Stay (HOSPITAL_COMMUNITY)
Admission: RE | Admit: 2019-05-07 | Discharge: 2019-05-10 | DRG: 788 | Disposition: A | Discharge status: Home / Self Care | Payer: BC Managed Care – PPO | Attending: Obstetrics and Gynecology | Admitting: Obstetrics and Gynecology

## 2019-05-07 ENCOUNTER — Inpatient Hospital Stay (HOSPITAL_COMMUNITY): Payer: BC Managed Care – PPO | Admitting: Anesthesiology

## 2019-05-07 ENCOUNTER — Encounter (HOSPITAL_COMMUNITY)
Admission: RE | Disposition: A | Discharge status: Home / Self Care | Payer: Self-pay | Source: Home / Self Care | Attending: Obstetrics and Gynecology

## 2019-05-07 ENCOUNTER — Other Ambulatory Visit: Payer: Self-pay

## 2019-05-07 ENCOUNTER — Encounter (HOSPITAL_COMMUNITY): Payer: Self-pay | Admitting: Obstetrics and Gynecology

## 2019-05-07 DIAGNOSIS — Z98891 History of uterine scar from previous surgery: Secondary | ICD-10-CM | POA: Diagnosis not present

## 2019-05-07 DIAGNOSIS — E669 Obesity, unspecified: Secondary | ICD-10-CM | POA: Diagnosis present

## 2019-05-07 DIAGNOSIS — K219 Gastro-esophageal reflux disease without esophagitis: Secondary | ICD-10-CM | POA: Diagnosis present

## 2019-05-07 DIAGNOSIS — Z3A39 39 weeks gestation of pregnancy: Secondary | ICD-10-CM

## 2019-05-07 DIAGNOSIS — O99214 Obesity complicating childbirth: Secondary | ICD-10-CM | POA: Diagnosis not present

## 2019-05-07 DIAGNOSIS — O9962 Diseases of the digestive system complicating childbirth: Secondary | ICD-10-CM | POA: Diagnosis present

## 2019-05-07 DIAGNOSIS — O34211 Maternal care for low transverse scar from previous cesarean delivery: Principal | ICD-10-CM | POA: Diagnosis present

## 2019-05-07 DIAGNOSIS — O0001 Abdominal pregnancy with intrauterine pregnancy: Secondary | ICD-10-CM | POA: Diagnosis not present

## 2019-05-07 DIAGNOSIS — Z3A00 Weeks of gestation of pregnancy not specified: Secondary | ICD-10-CM | POA: Diagnosis not present

## 2019-05-07 DIAGNOSIS — Z23 Encounter for immunization: Secondary | ICD-10-CM | POA: Diagnosis not present

## 2019-05-07 HISTORY — DX: History of uterine scar from previous surgery: Z98.891

## 2019-05-07 SURGERY — Surgical Case
Anesthesia: Spinal | Site: Abdomen | Wound class: Clean Contaminated

## 2019-05-07 MED ORDER — MORPHINE SULFATE (PF) 0.5 MG/ML IJ SOLN
INTRAMUSCULAR | Status: AC
  Filled 2019-05-07: qty 10

## 2019-05-07 MED ORDER — OXYCODONE HCL 5 MG PO TABS
5.0000 mg | ORAL_TABLET | ORAL | Status: DC | PRN
  Administered 2019-05-08 (×4): 10 mg via ORAL
  Administered 2019-05-08: 5 mg via ORAL
  Administered 2019-05-09 – 2019-05-10 (×7): 10 mg via ORAL
  Filled 2019-05-07 (×11): qty 2
  Filled 2019-05-07: qty 1

## 2019-05-07 MED ORDER — NALBUPHINE HCL 10 MG/ML IJ SOLN: 5.0000 mg | Freq: Once | INTRAMUSCULAR | Status: DC | PRN

## 2019-05-07 MED ORDER — OXYTOCIN 40 UNITS IN NORMAL SALINE INFUSION - SIMPLE MED: 2.5000 [IU]/h | INTRAVENOUS | Status: AC

## 2019-05-07 MED ORDER — ONDANSETRON HCL 4 MG/2ML IJ SOLN: 4.0000 mg | Freq: Three times a day (TID) | INTRAMUSCULAR | Status: DC | PRN

## 2019-05-07 MED ORDER — IBUPROFEN 800 MG PO TABS
800.0000 mg | ORAL_TABLET | Freq: Three times a day (TID) | ORAL | Status: DC
  Administered 2019-05-07 – 2019-05-10 (×8): 800 mg via ORAL
  Filled 2019-05-07 (×8): qty 1

## 2019-05-07 MED ORDER — FENTANYL CITRATE (PF) 100 MCG/2ML IJ SOLN
INTRAMUSCULAR | Status: AC
  Filled 2019-05-07: qty 2

## 2019-05-07 MED ORDER — KETOROLAC TROMETHAMINE 30 MG/ML IJ SOLN
30.0000 mg | Freq: Four times a day (QID) | INTRAMUSCULAR | Status: AC | PRN
  Administered 2019-05-07: 30 mg via INTRAVENOUS
  Filled 2019-05-07: qty 1

## 2019-05-07 MED ORDER — DEXAMETHASONE SODIUM PHOSPHATE 10 MG/ML IJ SOLN
INTRAMUSCULAR | Status: AC
  Filled 2019-05-07: qty 1

## 2019-05-07 MED ORDER — BUPIVACAINE IN DEXTROSE 0.75-8.25 % IT SOLN
INTRATHECAL | Status: DC | PRN
  Administered 2019-05-07: 1.8 mg via INTRATHECAL

## 2019-05-07 MED ORDER — SIMETHICONE 80 MG PO CHEW
80.0000 mg | CHEWABLE_TABLET | Freq: Three times a day (TID) | ORAL | Status: DC
  Administered 2019-05-07 – 2019-05-10 (×8): 80 mg via ORAL
  Filled 2019-05-07 (×8): qty 1

## 2019-05-07 MED ORDER — LACTATED RINGERS IV SOLN: INTRAVENOUS | Status: DC

## 2019-05-07 MED ORDER — ZOLPIDEM TARTRATE 5 MG PO TABS: 5.0000 mg | ORAL_TABLET | Freq: Every evening | ORAL | Status: DC | PRN

## 2019-05-07 MED ORDER — NALOXONE HCL 4 MG/10ML IJ SOLN
1.0000 ug/kg/h | INTRAVENOUS | Status: DC | PRN
  Filled 2019-05-07: qty 5

## 2019-05-07 MED ORDER — SODIUM CHLORIDE 0.9% FLUSH: 3.0000 mL | INTRAVENOUS | Status: DC | PRN

## 2019-05-07 MED ORDER — DIPHENHYDRAMINE HCL 50 MG/ML IJ SOLN: 12.5000 mg | INTRAMUSCULAR | Status: DC | PRN

## 2019-05-07 MED ORDER — PHENYLEPHRINE 40 MCG/ML (10ML) SYRINGE FOR IV PUSH (FOR BLOOD PRESSURE SUPPORT): PREFILLED_SYRINGE | INTRAVENOUS | Status: DC | PRN

## 2019-05-07 MED ORDER — DIBUCAINE (PERIANAL) 1 % EX OINT: 1.0000 "application " | TOPICAL_OINTMENT | CUTANEOUS | Status: DC | PRN

## 2019-05-07 MED ORDER — PHENYLEPHRINE HCL-NACL 20-0.9 MG/250ML-% IV SOLN
INTRAVENOUS | Status: AC
  Filled 2019-05-07: qty 250

## 2019-05-07 MED ORDER — NALBUPHINE HCL 10 MG/ML IJ SOLN: 5.0000 mg | INTRAMUSCULAR | Status: DC | PRN

## 2019-05-07 MED ORDER — FENTANYL CITRATE (PF) 100 MCG/2ML IJ SOLN
25.0000 ug | INTRAMUSCULAR | Status: DC | PRN
  Administered 2019-05-07: 50 ug via INTRAVENOUS

## 2019-05-07 MED ORDER — MORPHINE SULFATE (PF) 0.5 MG/ML IJ SOLN
INTRAMUSCULAR | Status: DC | PRN
  Administered 2019-05-07: .15 mg via INTRATHECAL

## 2019-05-07 MED ORDER — NALOXONE HCL 0.4 MG/ML IJ SOLN: 0.4000 mg | INTRAMUSCULAR | Status: DC | PRN

## 2019-05-07 MED ORDER — HYDROMORPHONE HCL 1 MG/ML IJ SOLN
0.5000 mg | Freq: Once | INTRAMUSCULAR | Status: AC
  Administered 2019-05-07: 12:00:00 0.5 mg via INTRAVENOUS
  Filled 2019-05-07: qty 1

## 2019-05-07 MED ORDER — MENTHOL 3 MG MT LOZG: 1.0000 | LOZENGE | OROMUCOSAL | Status: DC | PRN

## 2019-05-07 MED ORDER — OXYTOCIN 40 UNITS IN NORMAL SALINE INFUSION - SIMPLE MED
INTRAVENOUS | Status: DC | PRN
  Administered 2019-05-07: 40 [IU] via INTRAVENOUS

## 2019-05-07 MED ORDER — PANTOPRAZOLE SODIUM 40 MG PO TBEC
40.0000 mg | DELAYED_RELEASE_TABLET | Freq: Every day | ORAL | Status: DC
  Filled 2019-05-07 (×2): qty 1

## 2019-05-07 MED ORDER — PRENATAL MULTIVITAMIN CH
1.0000 | ORAL_TABLET | Freq: Every day | ORAL | Status: DC
  Filled 2019-05-07 (×2): qty 1

## 2019-05-07 MED ORDER — COCONUT OIL OIL: 1.0000 "application " | TOPICAL_OIL | Status: DC | PRN

## 2019-05-07 MED ORDER — WITCH HAZEL-GLYCERIN EX PADS: 1.0000 "application " | MEDICATED_PAD | CUTANEOUS | Status: DC | PRN

## 2019-05-07 MED ORDER — SODIUM CHLORIDE 0.9 % IV SOLN: INTRAVENOUS | Status: DC | PRN

## 2019-05-07 MED ORDER — SENNOSIDES-DOCUSATE SODIUM 8.6-50 MG PO TABS
2.0000 | ORAL_TABLET | ORAL | Status: DC
  Administered 2019-05-08 – 2019-05-10 (×3): 2 via ORAL
  Filled 2019-05-07 (×3): qty 2

## 2019-05-07 MED ORDER — SIMETHICONE 80 MG PO CHEW
80.0000 mg | CHEWABLE_TABLET | ORAL | Status: DC
  Administered 2019-05-08 – 2019-05-10 (×3): 80 mg via ORAL
  Filled 2019-05-07 (×3): qty 1

## 2019-05-07 MED ORDER — SIMETHICONE 80 MG PO CHEW: 80.0000 mg | CHEWABLE_TABLET | ORAL | Status: DC | PRN

## 2019-05-07 MED ORDER — PRENATAL MULTIVITAMIN CH: 1.0000 | ORAL_TABLET | Freq: Every day | ORAL | Status: DC

## 2019-05-07 MED ORDER — KETOROLAC TROMETHAMINE 30 MG/ML IJ SOLN
INTRAMUSCULAR | Status: AC
  Filled 2019-05-07: qty 1

## 2019-05-07 MED ORDER — ONDANSETRON HCL 4 MG/2ML IJ SOLN
INTRAMUSCULAR | Status: DC | PRN
  Administered 2019-05-07: 4 mg via INTRAVENOUS

## 2019-05-07 MED ORDER — TETANUS-DIPHTH-ACELL PERTUSSIS 5-2.5-18.5 LF-MCG/0.5 IM SUSP: 0.5000 mL | Freq: Once | INTRAMUSCULAR | Status: DC

## 2019-05-07 MED ORDER — DIPHENHYDRAMINE HCL 25 MG PO CAPS: 25.0000 mg | ORAL_CAPSULE | Freq: Four times a day (QID) | ORAL | Status: DC | PRN

## 2019-05-07 MED ORDER — DEXAMETHASONE SODIUM PHOSPHATE 10 MG/ML IJ SOLN
INTRAMUSCULAR | Status: DC | PRN
  Administered 2019-05-07: 10 mg via INTRAVENOUS

## 2019-05-07 MED ORDER — MEPERIDINE HCL 25 MG/ML IJ SOLN: 6.2500 mg | INTRAMUSCULAR | Status: DC | PRN

## 2019-05-07 MED ORDER — PHENYLEPHRINE HCL-NACL 20-0.9 MG/250ML-% IV SOLN
INTRAVENOUS | Status: DC | PRN
  Administered 2019-05-07: 60 ug/min via INTRAVENOUS

## 2019-05-07 MED ORDER — CEFAZOLIN SODIUM-DEXTROSE 2-4 GM/100ML-% IV SOLN
INTRAVENOUS | Status: AC
  Filled 2019-05-07: qty 100

## 2019-05-07 MED ORDER — CEFAZOLIN SODIUM-DEXTROSE 2-4 GM/100ML-% IV SOLN
2.0000 g | INTRAVENOUS | Status: AC
  Administered 2019-05-07: 08:00:00 2 g via INTRAVENOUS

## 2019-05-07 MED ORDER — DIPHENHYDRAMINE HCL 25 MG PO CAPS: 25.0000 mg | ORAL_CAPSULE | ORAL | Status: DC | PRN

## 2019-05-07 MED ORDER — ONDANSETRON HCL 4 MG/2ML IJ SOLN
INTRAMUSCULAR | Status: AC
  Filled 2019-05-07: qty 2

## 2019-05-07 MED ORDER — OXYTOCIN 40 UNITS IN NORMAL SALINE INFUSION - SIMPLE MED
INTRAVENOUS | Status: AC
  Filled 2019-05-07: qty 1000

## 2019-05-07 MED ORDER — FENTANYL CITRATE (PF) 100 MCG/2ML IJ SOLN
INTRAMUSCULAR | Status: DC | PRN
  Administered 2019-05-07: 15 ug via INTRATHECAL

## 2019-05-07 MED ORDER — KETOROLAC TROMETHAMINE 30 MG/ML IJ SOLN
30.0000 mg | Freq: Four times a day (QID) | INTRAMUSCULAR | Status: AC | PRN
  Administered 2019-05-07: 09:00:00 30 mg via INTRAMUSCULAR

## 2019-05-07 SURGICAL SUPPLY — 39 items
BENZOIN TINCTURE PRP APPL 2/3 (GAUZE/BANDAGES/DRESSINGS) ×3 IMPLANT
CHLORAPREP W/TINT 26ML (MISCELLANEOUS) ×3 IMPLANT
CLAMP CORD UMBIL (MISCELLANEOUS) IMPLANT
CLOSURE STERI STRIP 1/2 X4 (GAUZE/BANDAGES/DRESSINGS) ×2 IMPLANT
CLOSURE WOUND 1/2 X4 (GAUZE/BANDAGES/DRESSINGS) ×1
CLOTH BEACON ORANGE TIMEOUT ST (SAFETY) ×3 IMPLANT
DRSG OPSITE POSTOP 4X10 (GAUZE/BANDAGES/DRESSINGS) ×3 IMPLANT
ELECT REM PT RETURN 9FT ADLT (ELECTROSURGICAL) ×3
ELECTRODE REM PT RTRN 9FT ADLT (ELECTROSURGICAL) ×1 IMPLANT
EXTRACTOR VACUUM M CUP 4 TUBE (SUCTIONS) ×2 IMPLANT
EXTRACTOR VACUUM M CUP 4' TUBE (SUCTIONS) ×1
GLOVE BIO SURGEON STRL SZ 6.5 (GLOVE) ×2 IMPLANT
GLOVE BIO SURGEONS STRL SZ 6.5 (GLOVE) ×1
GLOVE BIOGEL PI IND STRL 7.0 (GLOVE) ×1 IMPLANT
GLOVE BIOGEL PI INDICATOR 7.0 (GLOVE) ×2
GOWN STRL REUS W/TWL LRG LVL3 (GOWN DISPOSABLE) ×6 IMPLANT
KIT ABG SYR 3ML LUER SLIP (SYRINGE) IMPLANT
NEEDLE HYPO 25X5/8 SAFETYGLIDE (NEEDLE) IMPLANT
NS IRRIG 1000ML POUR BTL (IV SOLUTION) ×3 IMPLANT
PACK C SECTION WH (CUSTOM PROCEDURE TRAY) ×3 IMPLANT
PAD OB MATERNITY 4.3X12.25 (PERSONAL CARE ITEMS) ×3 IMPLANT
PENCIL SMOKE EVAC W/HOLSTER (ELECTROSURGICAL) ×3 IMPLANT
RTRCTR C-SECT PINK 25CM LRG (MISCELLANEOUS) ×3 IMPLANT
STRIP CLOSURE SKIN 1/2X4 (GAUZE/BANDAGES/DRESSINGS) ×2 IMPLANT
SUT MNCRL 0 VIOLET CTX 36 (SUTURE) ×2 IMPLANT
SUT MONOCRYL 0 CTX 36 (SUTURE) ×4
SUT PLAIN 1 NONE 54 (SUTURE) IMPLANT
SUT PLAIN 2 0 (SUTURE) ×2
SUT PLAIN 2 0 XLH (SUTURE) ×3 IMPLANT
SUT PLAIN ABS 2-0 CT1 27XMFL (SUTURE) ×1 IMPLANT
SUT VIC AB 0 CT1 27 (SUTURE) ×4
SUT VIC AB 0 CT1 27XBRD ANBCTR (SUTURE) ×2 IMPLANT
SUT VIC AB 2-0 CT1 27 (SUTURE) ×2
SUT VIC AB 2-0 CT1 TAPERPNT 27 (SUTURE) ×1 IMPLANT
SUT VIC AB 4-0 KS 27 (SUTURE) ×3 IMPLANT
SYR BULB IRRIGATION 50ML (SYRINGE) ×3 IMPLANT
TOWEL OR 17X24 6PK STRL BLUE (TOWEL DISPOSABLE) ×3 IMPLANT
TRAY FOLEY W/BAG SLVR 14FR LF (SET/KITS/TRAYS/PACK) ×3 IMPLANT
WATER STERILE IRR 1000ML POUR (IV SOLUTION) ×3 IMPLANT

## 2019-05-07 NOTE — Interval H&P Note (Signed)
History and Physical Interval Note:  05/07/2019 7:02 AM  Tracy Evans  has presented today for surgery, with the diagnosis of repeat c-section.  The various methods of treatment have been discussed with the patient and family. After consideration of risks, benefits and other options for treatment, the patient has consented to  Procedure(s) with comments: CESAREAN SECTION (N/A) - [name],  RNFA as a surgical intervention.  The patient's history has been reviewed, patient examined, no change in status, stable for surgery.  I have reviewed the patient's chart and labs.  Questions were answered to the patient's satisfaction.     [name]   

## 2019-05-07 NOTE — Brief Op Note (Signed)
05/07/2019  8:50 AM  PATIENT:  Tracy Evans  34 y.o. female  PRE-OPERATIVE DIAGNOSIS:  repeat c-section  POST-OPERATIVE DIAGNOSIS:  repeat c-section  PROCEDURE:  Procedure(s) with comments: CESAREAN SECTION (N/A) - [name],  RNFA  SURGEON:  Surgeon(s) and Role:    * Bovard-Stuckert, Ananias Kolander, MD - Primary  ASSISTANTS: Medical laboratory scientific officer, [name] RNFA   ANESTHESIA:   spinal  EBL:  252cc, IVF and uop per anesthesia, clear urine per anesthesia   FINDINGS: viable female infant, Tracy Evans, at 7:56, apgars 9/9, Wt P; nl uterus, tubes and ovaries  BLOOD ADMINISTERED:none  DRAINS: Urinary Catheter (Foley)   LOCAL MEDICATIONS USED:  NONE  SPECIMEN:  Source of Specimen:  Placenta  DISPOSITION OF SPECIMEN:  L&D  COUNTS:  YES  TOURNIQUET:  * No tourniquets in log *  DICTATION: .Other Dictation: Dictation Number [id number]  PLAN OF CARE: Admit to inpatient   PATIENT DISPOSITION:  PACU - hemodynamically stable.   Delay start of Pharmacological VTE agent (>24hrs) due to surgical blood loss or risk of bleeding: not applicable

## 2019-05-07 NOTE — Anesthesia Procedure Notes (Signed)
Spinal  Patient location during procedure: OR Start time: 05/07/2019 7:26 AM End time: 05/07/2019 7:29 AM Staffing Performed: anesthesiologist  Anesthesiologist: Mal Amabile, MD Preanesthetic Checklist Completed: patient identified, IV checked, site marked, risks and benefits discussed, surgical consent, monitors and equipment checked, pre-op evaluation and timeout performed Spinal Block Patient position: sitting Prep: DuraPrep and site prepped and draped Patient monitoring: heart rate, cardiac monitor, continuous pulse ox and blood pressure Approach: midline Location: L3-4 Injection technique: single-shot Needle Needle type: Pencan  Needle gauge: 24 G Needle length: 9 cm Needle insertion depth: 6 cm Assessment Sensory level: T4 Additional Notes Patient tolerated procedure well. Adequate sensory level.

## 2019-05-07 NOTE — Lactation Note (Signed)
This note was copied from a baby's chart. Lactation Consultation Note  Patient Name: Tracy Evans [id number] Date: 05/07/2019 Reason for consult: Initial assessment;Term  P3 mother whose infant is now 16 hours old.  Mother breast fed her other two children (now 36 years old and 37 months old) for 63 months-1 year.  Both  children had "tongue ties."    Baby was asleep on mother's chest STS when I arrived.  Discussed the history of both children having tongue ties.  Mother very knowledgeable and interested in "seeing how this baby does" with feedings.  I did not disrupt the baby to assess mother's breasts but she informed me that her nipples are beginning to stick out some now.  Offered breast shells and a manual pump but mother was not interested.  She would just prefer to keep working with her baby.  Acknowledged her request and asked her to contact lactation if she needs assistance with latching or feels pinching with feedings.  At this time baby has remained very sleepy and I reassured mother this is typical behavior at this age.  Suggested she have the pediatrician assess on rounds.  Mother will do this.   Encouraged to feed 8-12 times/24 hours or sooner if baby shows feeding cues.  Mother is familiar with hand expression and did not wish to review.  She has seen colostrum drops.  Container provided and milk storage times reviewed.  Finger feeding demonstrated.  Mother has a DEBP for home use.  Father present.  Mom made aware of O/P services, breastfeeding support groups, community resources, and our phone # for post-discharge questions. Mother will call as needed.  Right now the room is dark and parents are resting.      Maternal Data Formula Feeding for Exclusion: No Has patient been taught Hand Expression?: Yes Does the patient have breastfeeding experience prior to this delivery?: Yes  Feeding Feeding Type: Breast Fed  LATCH Score                   Interventions     Lactation Tools Discussed/Used     Consult Status Consult Status: Follow-up Date: 05/08/19 Follow-up type: In-patient    Dora Sims 05/07/2019, 7:58 PM

## 2019-05-07 NOTE — Interval H&P Note (Signed)
History and Physical Interval Note:  05/07/2019 7:02 AM  Tracy Evans  has presented today for surgery, with the diagnosis of repeat c-section.  The various methods of treatment have been discussed with the patient and family. After consideration of risks, benefits and other options for treatment, the patient has consented to  Procedure(s) with comments: CESAREAN SECTION (N/A) - [name],  RNFA as a surgical intervention.  The patient's history has been reviewed, patient examined, no change in status, stable for surgery.  I have reviewed the patient's chart and labs.  Questions were answered to the patient's satisfaction.     Tracy Evans

## 2019-05-07 NOTE — Transfer of Care (Signed)
Immediate Anesthesia Transfer of Care Note  Patient: Tracy Evans  Procedure(s) Performed: CESAREAN SECTION (N/A Abdomen)  Patient Location: PACU  Anesthesia Type:Spinal  Level of Consciousness: awake  Airway & Oxygen Therapy: Patient Spontanous Breathing  Post-op Assessment: Report given to RN and Post -op Vital signs reviewed and stable  Post vital signs: Reviewed and stable  Last Vitals:  Vitals Value Taken Time  BP 123/75 05/07/19 0851  Temp    Pulse 90 05/07/19 0855  Resp 14 05/07/19 0855  SpO2 97 % 05/07/19 0855  Vitals shown include unvalidated device data.  Last Pain:  Vitals:   05/07/19 0541  TempSrc: Oral  PainSc: 0-No pain         Complications: No apparent anesthesia complications

## 2019-05-07 NOTE — Anesthesia Postprocedure Evaluation (Signed)
Anesthesia Post Note  Patient: Tracy Evans  Procedure(s) Performed: CESAREAN SECTION (N/A Abdomen)     Patient location during evaluation: PACU Anesthesia Type: Spinal Level of consciousness: oriented and awake and alert Pain management: pain level controlled Vital Signs Assessment: post-procedure vital signs reviewed and stable Respiratory status: spontaneous breathing, respiratory function stable and nonlabored ventilation Cardiovascular status: blood pressure returned to baseline and stable Postop Assessment: no headache, no backache, no apparent nausea or vomiting, spinal receding and patient able to bend at knees Anesthetic complications: no    Last Vitals:  Vitals:   05/07/19 0945 05/07/19 0957  BP: 117/70 118/69  Pulse: 71 76  Resp: 15   Temp: 36.5 C 36.8 C  SpO2: 98% 97%    Last Pain:  Vitals:   05/07/19 1000  TempSrc:   PainSc: 2    Pain Goal:                   Marigold Mom A.

## 2019-05-08 LAB — CBC
HCT: 36.6 % (ref 36.0–46.0)
Hemoglobin: 12.1 g/dL (ref 12.0–15.0)
MCH: 31.2 pg (ref 26.0–34.0)
MCHC: 33.1 g/dL (ref 30.0–36.0)
MCV: 94.3 fL (ref 80.0–100.0)
Platelets: 191 10*3/uL (ref 150–400)
RBC: 3.88 MIL/uL (ref 3.87–5.11)
RDW: 12.4 % (ref 11.5–15.5)
WBC: 12.7 10*3/uL — ABNORMAL HIGH (ref 4.0–10.5)
nRBC: 0 % (ref 0.0–0.2)

## 2019-05-08 NOTE — Progress Notes (Signed)
Subjective: Postpartum Day #1: Cesarean Delivery Patient reports incisional pain, tolerating PO and no problems voiding.    Objective: Vital signs in last 24 hours: Temp:  [97.6 F (36.4 C)-98.6 F (37 C)] 97.6 F (36.4 C) (04/06 0532) Pulse Rate:  [71-85] 77 (04/06 0532) Resp:  [11-18] 18 (04/06 0532) BP: (113-124)/(65-76) 123/67 (04/06 0532) SpO2:  [96 %-99 %] 99 % (04/05 1652)  Physical Exam:  General: alert Lochia: appropriate Uterine Fundus: firm Incision: dressing C/D/I   Recent Labs    05/05/19 0957 05/08/19 0551  HGB 13.9 12.1  HCT 41.3 36.6    Assessment/Plan: Status post Cesarean section. Doing well postoperatively.  Continue current care, ambulate.  Leighton Roach Daruis Swaim 05/08/2019, 8:18 AM

## 2019-05-08 NOTE — Op Note (Signed)
NAME: Tracy Evans, LARSEN MEDICAL RECORD [id number] ACCOUNT [id number] DATE OF BIRTH:1986/01/06 FACILITY: [facility] LOCATION: [facility] PHYSICIAN:Quandre Polinski BOVARD-STUCKERT, MD  OPERATIVE REPORT  DATE OF PROCEDURE:  05/07/2019  PREOPERATIVE DIAGNOSES:  Intrauterine pregnancy at term, history of low transverse cesarean section x2, desires repeat.   POSTOPERATIVE DIAGNOSES:  Intrauterine pregnancy at term, history of low transverse cesarean section x2, desires repeat, delivered.  PROCEDURE:  Repeat low transverse cesarean section.  SURGEON:  Sherron Monday, MD   ASSISTANT:  Luiz Blare, RNFA  ANESTHESIA:  Spinal.  IV FLUIDS AND URINE OUTPUT:  Per anesthesia.  Clear urine at the end of the procedure.  ESTIMATED BLOOD LOSS:  Approximately 252 mL.  FINDINGS:  Viable female infant named Charlotte at 7:56 a.m. with Apgars of 9 at 1 minute and 9 at 5 minutes and weight pending at the time of dictation.  Normal uterus, tubes and ovaries are noted.  COMPLICATIONS:  None.  PATHOLOGY:  Placenta to labor and delivery.  DESCRIPTION OF PROCEDURE:  After informed consent was reviewed with the patient and her husband, she was transported to the operating room where spinal anesthesia was placed and found to be adequate.  She was then returned to the supine position with a  leftward tilt, prepped and draped in the normal sterile fashion after an appropriate timeout was performed.  The Pfannenstiel incision was made at the level of the previous incision, carried through to the underlying layer of fascia sharply.  Fascia was  incised in the midline and the incision was extended laterally with Mayo scissors.  Superior aspect of the fascial incision was grasped with Kocher clamps, elevated and the rectus muscles were dissected off both bluntly and sharply.  Midline was easily  identified and the peritoneum was entered sharply.  The incision was extended superiorly and inferiorly with good visualization of the  bladder.  Alexis skin retractor was placed carefully making sure that no bowel was entrapped.  A bladder flap was  created after the vesicouterine peritoneum was identified.  The uterus was incised in transverse fashion and the infant was delivered from a vertex presentation without complication.  Nose and mouth were suctioned on the field.  Cord was clamped and cut  after waiting a minute.  The infant was handed off to the waiting pediatric staff.  The placenta was expressed.  The uterus was cleared of all clot and debris.  Uterine incision was closed with 2 layers of 0 Monocryl, the 1st of which was running locked,  and the 2nd as an imbricating layer.  The incision was noted to be hemostatic.  The gutters were cleared of all clot and debris.  The adnexa were inspected and found to be normal.  The Alexis retractor was removed.  The peritoneum was reapproximated  with 2-0 Vicryl in a running fashion.  Subfascial planes were inspected and found to be hemostatic.  The fascia was reapproximated with 0 Vicryl from either end overlapping in the midline.  The subcuticular adipose layer was made hemostatic with Bovie  cautery.  The dead space was closed with 3-0 plain gut.  The skin was closed with 4-0 Vicryl on a Keith needle in subcuticular fashion.  Benzoin and Steri-Strips were applied.  The patient tolerated the procedure well.  Sponge, lap and needle counts were  correct x2 per the operating staff.  CN/NUANCE  D:05/07/2019 T:05/07/2019 JOB:[id number]

## 2019-05-09 NOTE — Progress Notes (Signed)
Subjective: Postpartum Day 2: Cesarean Delivery Patient reports incisional pain, tolerating PO, + flatus and no problems voiding.  Increasing ambulation--feels needs one more day before d/c with small children at home  Objective: Vital signs in last 24 hours: Temp:  [98 F (36.7 C)-98.9 F (37.2 C)] 98.9 F (37.2 C) (04/07 0537) Pulse Rate:  [81-83] 83 (04/07 0537) Resp:  [16-18] 18 (04/07 0537) BP: (107-121)/(69-71) 116/69 (04/07 0537) SpO2:  [97 %-99 %] 97 % (04/06 2150)  Physical Exam:  General: alert and cooperative Lochia: appropriate Uterine Fundus: firm Incision: C/D/I   Recent Labs    05/08/19 0551  HGB 12.1  HCT 36.6    Assessment/Plan: Status post Cesarean section. Doing well postoperatively.  Continue current care, plan d/c home tomorrow.  Oliver Pila 05/09/2019, 8:36 AM

## 2019-05-09 NOTE — Lactation Note (Signed)
This note was copied from a baby's chart. Lactation Consultation Note  Patient Name: Girl Loany Neuroth VNRWC'H Date: 05/09/2019 Reason for consult:  8 % weight loss , per mom milk came in last night and the Pedis  Said I didn't have to supplement due to mom milk being in.  Baby latched, mom mentioned the baby was on shallow.  LC flipped upper lip and eased down chin and increased swallows noted With breast compressions. Baby fed for 15 mins . And per mom when abby released  Nipple was well rounded.  LC  Mentioned tonight if she has any engorgement to ask the [facility] for ice packs and hand pump .     Maternal Data    Feeding Feeding Type: Breast Fed  LATCH Score Latch: (latched with depth)  Audible Swallowing: (swallows increased with breast compresions)  Type of Nipple: Everted at rest and after stimulation  Comfort (Breast/Nipple): (milk in - not engorged)  Hold (Positioning): (mom independent with latch)  LATCH Score: 8  Interventions Interventions: Breast feeding basics reviewed  Lactation Tools Discussed/Used WIC Program: No   Consult Status Consult Status: Follow-up Date: 05/10/19 Follow-up type: In-patient    Matilde Sprang Rees Santistevan 05/09/2019, 3:30 PM

## 2019-05-10 MED ORDER — DOCUSATE SODIUM 100 MG PO CAPS: 100.0000 mg | ORAL_CAPSULE | Freq: Two times a day (BID) | ORAL | 0 refills | Status: DC

## 2019-05-10 MED ORDER — OXYCODONE HCL 5 MG PO TABS: 5.0000 mg | ORAL_TABLET | Freq: Four times a day (QID) | ORAL | 0 refills | Status: DC | PRN

## 2019-05-10 MED ORDER — IBUPROFEN 800 MG PO TABS: 800.0000 mg | ORAL_TABLET | Freq: Three times a day (TID) | ORAL | 1 refills | Status: DC

## 2019-05-10 NOTE — Progress Notes (Signed)
Subjective: Postpartum Day 3: Cesarean Delivery Patient reports incisional pain, tolerating PO, + flatus and no problems voiding.  Increasing ambulation, ready to go home  Objective: Vital signs in last 24 hours: Temp:  [98.3 F (36.8 C)] 98.3 F (36.8 C) (04/08 0542) Pulse Rate:  [79-85] 79 (04/08 0542) Resp:  [19-20] 20 (04/08 0542) BP: (117-118)/(66-72) 118/66 (04/08 0542) SpO2:  [98 %] 98 % (04/08 0542)  Physical Exam:  General: alert and cooperative Lochia: appropriate Uterine Fundus: firm Incision: C/D/I   Recent Labs    05/08/19 0551  HGB 12.1  HCT 36.6    Assessment/Plan: Status post Cesarean section. Doing well postoperatively.  Continue current care, plan d/c home today.  Tracy Evans Tracy Evans 05/10/2019, 8:26 AM

## 2019-05-10 NOTE — Lactation Note (Signed)
This note was copied from a baby's chart. Lactation Consultation Note  Patient Name: Tracy Evans JOINO'M Date: 05/10/2019 Reason for consult: Follow-up assessment   P3, Baby 72 hours old.  Stools transitioning to green.  Mother latched baby in cradle hold.  Recommend giving head support and latching in cross cradle hold.  Baby latches easily.  Intermittent swallows observed.  Feed on demand with cues.  Goal 8-12+ times per day after first 24 hrs.  Place baby STS if not cueing.  Reviewed engorgement care and monitoring voids/stools.    Maternal Data    Feeding Feeding Type: Breast Fed  LATCH Score Latch: Grasps breast easily, tongue down, lips flanged, rhythmical sucking.  Audible Swallowing: A few with stimulation  Type of Nipple: Everted at rest and after stimulation  Comfort (Breast/Nipple): Soft / non-tender  Hold (Positioning): No assistance needed to correctly position infant at breast.  LATCH Score: 9  Interventions Interventions: Breast feeding basics reviewed  Lactation Tools Discussed/Used     Consult Status Consult Status: Complete Date: 05/10/19    Dahlia Byes [facility] 05/10/2019, 8:23 AM

## 2019-05-10 NOTE — Discharge Summary (Signed)
OB Discharge Summary     Patient Name: Tracy Evans DOB: Jun 29, 1985 MRN: [id number]  Date of admission: 05/07/2019 Delivering MD: Sherian Rein   Date of discharge: 05/10/2019  Admitting diagnosis: Status post repeat low transverse cesarean section [Z98.891] Intrauterine pregnancy: [redacted]w[redacted]d     Secondary diagnosis:  Principal Problem:   Status post repeat low transverse cesarean section  Additional problems: AMA, h/o csx x2     Discharge diagnosis: Term Pregnancy Delivered                                                                                                Post partum procedures:none  Complications: None  Hospital course:  Sceduled C/S   34 y.o. yo G3P3003 at [redacted]w[redacted]d was admitted to the hospital 05/07/2019 for scheduled cesarean section with the following indication:Elective Repeat.  Membrane Rupture Time/Date: 7:54 AM ,05/07/2019   Patient delivered a Viable infant.05/07/2019  Details of operation can be found in separate operative note.  Pateint had an uncomplicated postpartum course.  She is ambulating, tolerating a regular diet, passing flatus, and urinating well. Patient is discharged home in stable condition on  05/10/19         Physical exam  Vitals:   05/08/19 2150 05/09/19 0537 05/09/19 2146 05/10/19 0542  BP: 107/71 116/69 117/72 118/66  Pulse: 81 83 85 79  Resp: 16 18 19 20   Temp: 98.4 F (36.9 C) 98.9 F (37.2 C) 98.3 F (36.8 C) 98.3 F (36.8 C)  TempSrc: Oral Oral Oral Oral  SpO2: 97%   98%  Weight:      Height:       General: alert, cooperative and no distress Lochia: appropriate Uterine Fundus: firm Incision: Healing well with no significant drainage, No significant erythema, Dressing is clean, dry, and intact DVT Evaluation: No evidence of DVT seen on physical exam. Negative Homan's sign. No cords or calf tenderness. Labs: Lab Results  Component Value Date   WBC 12.7 (H) 05/08/2019   HGB 12.1 05/08/2019   HCT 36.6 05/08/2019   MCV 94.3 05/08/2019   PLT 191 05/08/2019   CMP Latest Ref Rng & Units 02/08/2018  Glucose 70 - 99 mg/dL 90  BUN 6 - 20 mg/dL 8  Creatinine 04/09/2018 - 5.46 mg/dL 2.70  Sodium 3.50 - 093 mmol/L 133(L)  Potassium 3.5 - 5.1 mmol/L 4.1  Chloride 98 - 111 mmol/L 104  CO2 22 - 32 mmol/L 21(L)  Calcium 8.9 - 10.3 mg/dL 818)  Total Protein 6.5 - 8.1 g/dL 7.0  Total Bilirubin 0.3 - 1.2 mg/dL 0.4  Alkaline Phos 38 - 126 U/L 99  AST 15 - 41 U/L 18  ALT 0 - 44 U/L 15    Discharge instruction: per After Visit Summary and "Baby and Me Booklet".  After visit meds:  Allergies as of 05/10/2019   No Known Allergies     Medication List    TAKE these medications   acetaminophen 500 MG tablet Commonly known as: TYLENOL Take 1,000 mg by mouth every 8 (eight) hours as needed (pain).   docusate sodium 100 MG capsule  Commonly known as: Colace Take 1 capsule (100 mg total) by mouth 2 (two) times daily. What changed:   how much to take  when to take this  reasons to take this   doxylamine (Sleep) 25 MG tablet Commonly known as: UNISOM Take 12.5 mg by mouth at bedtime as needed for sleep.   esomeprazole 40 MG capsule Commonly known as: NEXIUM Take 40 mg by mouth daily at 12 noon.   ibuprofen 800 MG tablet Commonly known as: ADVIL Take 1 tablet (800 mg total) by mouth every 8 (eight) hours.   OVER THE COUNTER MEDICATION Apply 1 application topically daily as needed (For restless legs.). [employer] Magnesium lotion   oxyCODONE 5 MG immediate release tablet Commonly known as: Oxy IR/ROXICODONE Take 1-2 tablets (5-10 mg total) by mouth every 6 (six) hours as needed for severe pain.   prenatal multivitamin Tabs tablet Take 1 tablet by mouth daily at 12 noon.       Diet: routine diet  Activity: Advance as tolerated. Pelvic rest for 6 weeks.   Outpatient follow up:6 weeks Follow up Appt:No future appointments. Follow up Visit:No follow-ups on file.  Postpartum contraception:  Condoms  Newborn Data: Live born female  Birth Weight: 7 lb 10.6 oz (3476 g) APGAR: 67, 9  Newborn Delivery   Birth date/time: 05/07/2019 07:56:00 Delivery type: C-Section, Low Transverse Trial of labor: No C-section categorization: Repeat      Baby Feeding: Breast Disposition:home with mother   05/10/2019 Deliah Boston, MD

## 2019-05-11 DIAGNOSIS — Z00110 Health examination for newborn under 8 days old: Secondary | ICD-10-CM | POA: Diagnosis not present

## 2019-05-14 LAB — BIRTH TISSUE RECOVERY COLLECTION (PLACENTA DONATION)

## 2019-06-06 DIAGNOSIS — M9901 Segmental and somatic dysfunction of cervical region: Secondary | ICD-10-CM | POA: Diagnosis not present

## 2019-06-06 DIAGNOSIS — M9903 Segmental and somatic dysfunction of lumbar region: Secondary | ICD-10-CM | POA: Diagnosis not present

## 2019-06-06 DIAGNOSIS — M546 Pain in thoracic spine: Secondary | ICD-10-CM | POA: Diagnosis not present

## 2019-06-06 DIAGNOSIS — M9902 Segmental and somatic dysfunction of thoracic region: Secondary | ICD-10-CM | POA: Diagnosis not present

## 2019-06-13 DIAGNOSIS — M9901 Segmental and somatic dysfunction of cervical region: Secondary | ICD-10-CM | POA: Diagnosis not present

## 2019-06-13 DIAGNOSIS — M546 Pain in thoracic spine: Secondary | ICD-10-CM | POA: Diagnosis not present

## 2019-06-13 DIAGNOSIS — M9902 Segmental and somatic dysfunction of thoracic region: Secondary | ICD-10-CM | POA: Diagnosis not present

## 2019-06-13 DIAGNOSIS — M9903 Segmental and somatic dysfunction of lumbar region: Secondary | ICD-10-CM | POA: Diagnosis not present

## 2019-06-20 DIAGNOSIS — M546 Pain in thoracic spine: Secondary | ICD-10-CM | POA: Diagnosis not present

## 2019-06-20 DIAGNOSIS — M9903 Segmental and somatic dysfunction of lumbar region: Secondary | ICD-10-CM | POA: Diagnosis not present

## 2019-06-20 DIAGNOSIS — Z1389 Encounter for screening for other disorder: Secondary | ICD-10-CM | POA: Diagnosis not present

## 2019-06-20 DIAGNOSIS — M9901 Segmental and somatic dysfunction of cervical region: Secondary | ICD-10-CM | POA: Diagnosis not present

## 2019-06-20 DIAGNOSIS — N8189 Other female genital prolapse: Secondary | ICD-10-CM | POA: Diagnosis not present

## 2019-06-20 DIAGNOSIS — M9902 Segmental and somatic dysfunction of thoracic region: Secondary | ICD-10-CM | POA: Diagnosis not present

## 2019-06-20 DIAGNOSIS — Z3009 Encounter for other general counseling and advice on contraception: Secondary | ICD-10-CM | POA: Diagnosis not present

## 2019-06-28 DIAGNOSIS — M9901 Segmental and somatic dysfunction of cervical region: Secondary | ICD-10-CM | POA: Diagnosis not present

## 2019-06-28 DIAGNOSIS — M546 Pain in thoracic spine: Secondary | ICD-10-CM | POA: Diagnosis not present

## 2019-06-28 DIAGNOSIS — M9902 Segmental and somatic dysfunction of thoracic region: Secondary | ICD-10-CM | POA: Diagnosis not present

## 2019-06-28 DIAGNOSIS — M9903 Segmental and somatic dysfunction of lumbar region: Secondary | ICD-10-CM | POA: Diagnosis not present

## 2019-07-05 DIAGNOSIS — M9903 Segmental and somatic dysfunction of lumbar region: Secondary | ICD-10-CM | POA: Diagnosis not present

## 2019-07-05 DIAGNOSIS — M9902 Segmental and somatic dysfunction of thoracic region: Secondary | ICD-10-CM | POA: Diagnosis not present

## 2019-07-05 DIAGNOSIS — M546 Pain in thoracic spine: Secondary | ICD-10-CM | POA: Diagnosis not present

## 2019-07-05 DIAGNOSIS — M9901 Segmental and somatic dysfunction of cervical region: Secondary | ICD-10-CM | POA: Diagnosis not present

## 2019-07-11 DIAGNOSIS — M9903 Segmental and somatic dysfunction of lumbar region: Secondary | ICD-10-CM | POA: Diagnosis not present

## 2019-07-11 DIAGNOSIS — M9901 Segmental and somatic dysfunction of cervical region: Secondary | ICD-10-CM | POA: Diagnosis not present

## 2019-07-11 DIAGNOSIS — M9902 Segmental and somatic dysfunction of thoracic region: Secondary | ICD-10-CM | POA: Diagnosis not present

## 2019-07-11 DIAGNOSIS — M546 Pain in thoracic spine: Secondary | ICD-10-CM | POA: Diagnosis not present

## 2019-07-18 DIAGNOSIS — M9902 Segmental and somatic dysfunction of thoracic region: Secondary | ICD-10-CM | POA: Diagnosis not present

## 2019-07-18 DIAGNOSIS — M9901 Segmental and somatic dysfunction of cervical region: Secondary | ICD-10-CM | POA: Diagnosis not present

## 2019-07-18 DIAGNOSIS — M546 Pain in thoracic spine: Secondary | ICD-10-CM | POA: Diagnosis not present

## 2019-07-18 DIAGNOSIS — M9903 Segmental and somatic dysfunction of lumbar region: Secondary | ICD-10-CM | POA: Diagnosis not present

## 2019-07-25 DIAGNOSIS — M546 Pain in thoracic spine: Secondary | ICD-10-CM | POA: Diagnosis not present

## 2019-07-25 DIAGNOSIS — M9902 Segmental and somatic dysfunction of thoracic region: Secondary | ICD-10-CM | POA: Diagnosis not present

## 2019-07-25 DIAGNOSIS — M9903 Segmental and somatic dysfunction of lumbar region: Secondary | ICD-10-CM | POA: Diagnosis not present

## 2019-07-25 DIAGNOSIS — M9901 Segmental and somatic dysfunction of cervical region: Secondary | ICD-10-CM | POA: Diagnosis not present

## 2019-07-26 ENCOUNTER — Encounter: Payer: Self-pay | Admitting: Physical Therapy

## 2019-07-26 ENCOUNTER — Other Ambulatory Visit: Payer: Self-pay

## 2019-07-26 ENCOUNTER — Ambulatory Visit: Payer: BC Managed Care – PPO | Attending: Obstetrics and Gynecology | Admitting: Physical Therapy

## 2019-07-26 DIAGNOSIS — M6281 Muscle weakness (generalized): Secondary | ICD-10-CM

## 2019-07-26 DIAGNOSIS — R279 Unspecified lack of coordination: Secondary | ICD-10-CM | POA: Diagnosis not present

## 2019-07-26 DIAGNOSIS — R293 Abnormal posture: Secondary | ICD-10-CM | POA: Diagnosis not present

## 2019-07-26 NOTE — Patient Instructions (Signed)

## 2019-07-26 NOTE — Therapy (Signed)
[facility] [address] [place], [place], [address] Phone: [phone]   Fax:  [phone]  Physical Therapy Evaluation  Patient Details  Name: Tracy Evans MRN: [id number] Date of Birth: [birth year] Referring Provider (PT): Sherian Rein, MD   Encounter Date: 07/26/2019   PT End of Session - 07/26/19 1242    Visit Number 1    Date for PT Re-Evaluation 11/15/19    PT Start Time 1233    PT Stop Time 1314    PT Time Calculation (min) 41 min    Activity Tolerance Patient tolerated treatment well    Behavior During Therapy [facility] for tasks assessed/performed           Past Medical History:  Diagnosis Date  . GERD (gastroesophageal reflux disease)   . H/O cesarean section complicating pregnancy 02/06/2018  . Headache   . Status post repeat low transverse cesarean section 05/07/2019    Past Surgical History:  Procedure Laterality Date  . APPENDECTOMY    . CESAREAN SECTION    . CESAREAN SECTION N/A 02/09/2018   Procedure: REPEAT CESAREAN SECTION;  Surgeon: Sherian Rein, MD;  Location: [facility];  Service: Obstetrics;  Laterality: N/A;  [name],  RNFA  . CESAREAN SECTION N/A 05/07/2019   Procedure: CESAREAN SECTION;  Surgeon: Sherian Rein, MD;  Location: [facility];  Service: Obstetrics;  Laterality: N/A;  [name],  RNFA    There were no vitals filed for this visit.    Subjective Assessment - 07/26/19 1236    Subjective Pt states she has had a lot of low back pain.  PUlled it badly after the first two pregnancies after putting a car seat in the car.  I am seeing a chiro regularly.    How long can you walk comfortably? 1.5-2 miles    Patient Stated Goals decrease back pain; get back to a little running    Currently in Pain? Yes    Pain Score 1    gets up to 7-8 if very sharp   Pain Location Back    Pain Orientation Right;Lower    Pain Descriptors / Indicators Sore;Sharp    Pain  Type Chronic pain    Pain Radiating Towards coccyx    Pain Onset More than a month ago    Pain Frequency Intermittent    Aggravating Factors  rolling in bed, bending over    Pain Relieving Factors advil few/week    Effect of Pain on Daily Activities cannot run; limits exercises; gets sore throughout the day              [facility] PT Assessment - 07/27/19 0001      Assessment   Medical Diagnosis N81.89 (ICD-10-CM) - Other female genital prolapse    Referring Provider (PT) Bovard-Stuckert, Augusto Gamble, MD    Onset Date/Surgical Date --   several years ago worse since April 2021   Prior Therapy Yes a little after first      Precautions   Precautions None      Balance Screen   Has the patient fallen in the past 6 months No      Home Environment   Living Environment Private residence    Living Arrangements Spouse/significant other;Children   3 children <4y/o     Prior Function   Level of Independence Independent    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     Cognition   Overall Cognitive Status Within Functional Limits for  tasks assessed      Observation/Other Assessments   Focus on Therapeutic Outcomes (FOTO)  --   self report 60% limited on a normal day     Posture/Postural Control   Posture/Postural Control Postural limitations    Postural Limitations Rounded Shoulders;Anterior pelvic tilt;Increased lumbar lordosis      ROM / Strength   AROM / PROM / Strength AROM;Strength;PROM      AROM   Overall AROM Comments lumbar 75% of normal range; side bend +pain      PROM   Overall PROM Comments hip flexion Lt LE produces pain in Rt SIJ      Strength   Overall Strength Comments hip flexion 3/5 MMT +pain Rt side      Flexibility   Soft Tissue Assessment /Muscle Length yes    Hamstrings Rt 75%; Lt 90%  to normal range      Palpation   Palpation comment lumbar and hip flexors tight      Special Tests   Other special tests ASLR - 7/10 without compression; 4/10 with compression       Ambulation/Gait   Gait Pattern Within Functional Limits                      Objective measurements completed on examination: See above findings.     Pelvic Floor Special Questions - 07/27/19 0001    Prior Pelvic/Prostate Exam Yes    Are you Pregnant or attempting pregnancy? No    Number of Pregnancies 3    Number of C-Sections 3    Currently Sexually Active No    Urinary Leakage No    Urinary urgency No    Fecal incontinence No    Fluid intake lots of coffee; 90ish oz water    External Palpation normal    Pelvic Floor Internal Exam pt identity confirmed and informed consent given to treat and assess    Exam Type Vaginal    Palpation pubococcygeus TTP at pubic attachments;     Strength weak squeeze, no lift    Strength # of reps 8   not relaxing all the way with quick flick   Strength # of seconds 18    Tone high                    PT Education - 07/26/19 1551    Education Details dry needling info    Person(s) Educated Patient    Methods Explanation;Demonstration;Verbal cues;Handout;Tactile cues    Comprehension Verbalized understanding;Returned demonstration            PT Short Term Goals - 07/26/19 1609      PT SHORT TERM GOAL #1   Title pt will be ind with initial HEP    Time 6    Period Weeks    Status New    Target Date 09/06/19      PT SHORT TERM GOAL #2   Title Pt will be able to hold sub max pelvic floor contraction for 60 seconds in order to demonstrate good endurance for running or longer walks    Time 6    Period Weeks    Status New    Target Date 09/06/19      PT SHORT TERM GOAL #3   Title Pt will be able to walk for >2 miles due to improved core strength    Time 6    Period Weeks    Status New    Target  Date 09/06/19             PT Long Term Goals - 07/26/19 1242      PT LONG TERM GOAL #1   Title Be able to run 30 minutes and/or do yoga class without increased    Time 16    Period Weeks    Status New     Target Date 11/15/19      PT LONG TERM GOAL #2   Title Pt will be able to lift and carry 20lb with improved core activation for at least 60% reduced pain    Time 16    Period Weeks    Status New    Target Date 11/15/19      PT LONG TERM GOAL #3   Title Pt will report only 20% limitations due to pelvic related pain due to improved posture and core    Baseline 60% limited    Time 16    Period Weeks    Status New    Target Date 11/15/19      PT LONG TERM GOAL #4   Title Pt will be ind with advnaced HEP    Time 16    Period Weeks    Status New    Target Date 11/15/19      PT LONG TERM GOAL #5   Title Pt will be able to perform single leg hop 10x without pain or leakage bilateral for return to running    Time 16    Period Weeks    Status New    Target Date 11/15/19                  Plan - 07/26/19 1552    Clinical Impression Statement Pt presents to skilled PT due to history of 3 c-sections occuring in less than 4 years.  She has been having significant back and pelvic ring pain since her last child was born. Pt demonstrates pain with lumbar side bending and flexion and PROM Lt hip flexion all producing pain in Rt SI joint.  Pt is negative for LC for SI joint pain. Pt does not have positive PSLR test.  She has pos ASLR with 7/10 down to 4/10 with compression.  Pt is TTP Rt SI joint.  She has Rt hip flexion weakness and weakness of pelvic floor is 2/10.  Pelvic floor is slightly higher in tone and slow to relax after contracting.  Pt has a lot of scar adhesions on the Rt side of the c-section incision.  Pt demonstrates posture abnormalities as mentioned above and will benefit from skilled PT to address impairments.    Personal Factors and Comorbidities Time since onset of injury/illness/exacerbation;Comorbidity 1    Comorbidities 3 c-sections    Examination-Activity Limitations Bend;Lift;Transfers    Examination-Participation Restrictions Community Activity;Laundry;Yard Work      Conservation officer, historic buildings Evolving/Moderate complexity    Clinical Decision Making Moderate    Rehab Potential Excellent    PT Frequency 2x / week    PT Duration Other (comment)   16 weeks due to delay in starting date   PT Treatment/Interventions ADLs/Self Care Home Management;Biofeedback;Cryotherapy;Electrical Stimulation;Moist Heat;Neuromuscular re-education;Therapeutic exercise;Therapeutic activities;Patient/family education;Manual techniques;Dry needling;Passive range of motion;Taping    PT Next Visit Plan lumbar STM (DN?), scar massageTrA activation and basic core strength, h/s stretches, hip hinges    Consulted and Agree with Plan of Care Patient           Patient will benefit from skilled therapeutic intervention  in order to improve the following deficits and impairments:  Pain, Postural dysfunction, Decreased strength, Impaired flexibility, Increased muscle spasms, Decreased coordination, Decreased range of motion  Visit Diagnosis: Muscle weakness (generalized)  Abnormal posture  Unspecified lack of coordination     Problem List Patient Active Problem List   Diagnosis Date Noted  . Status post repeat low transverse cesarean section 05/07/2019  . H/O cesarean section complicating pregnancy 02/06/2018    Junious Silk, PT 07/27/2019, 9:30 AM  [facility] [address] [place], [place], [address] Phone: [phone]   Fax:  [phone]  Name: Tracy Evans MRN: [id number] Date of Birth: 09/07/1985

## 2019-08-02 DIAGNOSIS — M9903 Segmental and somatic dysfunction of lumbar region: Secondary | ICD-10-CM | POA: Diagnosis not present

## 2019-08-02 DIAGNOSIS — M9902 Segmental and somatic dysfunction of thoracic region: Secondary | ICD-10-CM | POA: Diagnosis not present

## 2019-08-02 DIAGNOSIS — M9901 Segmental and somatic dysfunction of cervical region: Secondary | ICD-10-CM | POA: Diagnosis not present

## 2019-08-02 DIAGNOSIS — M546 Pain in thoracic spine: Secondary | ICD-10-CM | POA: Diagnosis not present

## 2019-08-09 DIAGNOSIS — M9902 Segmental and somatic dysfunction of thoracic region: Secondary | ICD-10-CM | POA: Diagnosis not present

## 2019-08-09 DIAGNOSIS — M9901 Segmental and somatic dysfunction of cervical region: Secondary | ICD-10-CM | POA: Diagnosis not present

## 2019-08-09 DIAGNOSIS — M546 Pain in thoracic spine: Secondary | ICD-10-CM | POA: Diagnosis not present

## 2019-08-09 DIAGNOSIS — M9903 Segmental and somatic dysfunction of lumbar region: Secondary | ICD-10-CM | POA: Diagnosis not present

## 2019-08-15 DIAGNOSIS — M9901 Segmental and somatic dysfunction of cervical region: Secondary | ICD-10-CM | POA: Diagnosis not present

## 2019-08-15 DIAGNOSIS — M9902 Segmental and somatic dysfunction of thoracic region: Secondary | ICD-10-CM | POA: Diagnosis not present

## 2019-08-15 DIAGNOSIS — M546 Pain in thoracic spine: Secondary | ICD-10-CM | POA: Diagnosis not present

## 2019-08-15 DIAGNOSIS — M9903 Segmental and somatic dysfunction of lumbar region: Secondary | ICD-10-CM | POA: Diagnosis not present

## 2019-08-23 DIAGNOSIS — M791 Myalgia, unspecified site: Secondary | ICD-10-CM | POA: Diagnosis not present

## 2019-08-23 DIAGNOSIS — L659 Nonscarring hair loss, unspecified: Secondary | ICD-10-CM | POA: Diagnosis not present

## 2019-08-23 DIAGNOSIS — G43909 Migraine, unspecified, not intractable, without status migrainosus: Secondary | ICD-10-CM | POA: Diagnosis not present

## 2019-08-23 DIAGNOSIS — Z0000 Encounter for general adult medical examination without abnormal findings: Secondary | ICD-10-CM | POA: Diagnosis not present

## 2019-08-30 ENCOUNTER — Other Ambulatory Visit: Payer: Self-pay

## 2019-08-30 ENCOUNTER — Ambulatory Visit: Payer: BC Managed Care – PPO | Attending: Obstetrics and Gynecology | Admitting: Physical Therapy

## 2019-08-30 DIAGNOSIS — R279 Unspecified lack of coordination: Secondary | ICD-10-CM | POA: Diagnosis not present

## 2019-08-30 DIAGNOSIS — M6281 Muscle weakness (generalized): Secondary | ICD-10-CM | POA: Diagnosis not present

## 2019-08-30 DIAGNOSIS — R293 Abnormal posture: Secondary | ICD-10-CM | POA: Diagnosis not present

## 2019-08-30 NOTE — Patient Instructions (Signed)
Access Code: [email or web address] URL: [email or web address] Date: 08/30/2019 Prepared by: Dwana Curd  Exercises Supine Hip Internal and External Rotation - 1 x daily - 7 x weekly - 10 reps - 1 sets - 5 sec hold Thoracic Extension Mobilization with Noodle - 1 x daily - 7 x weekly - 1 sets - 10 reps - 10 sec hold Cat-Camel to Child's Pose - 1 x daily - 7 x weekly - 5 reps - 1 sets - 10 sec hold Supine Pelvic Floor Stretch - 1 x daily - 7 x weekly - 3 reps - 1 sets - 30 sec hold Static Prone on Elbows - 1 x daily - 7 x weekly - 3 sets - 10 reps Hooklying Small March - 1 x daily - 7 x weekly - 10 reps - 2 sets Quadruped Alternating Arm Lift - 1 x daily - 7 x weekly - 10 reps - 2 sets

## 2019-08-30 NOTE — Therapy (Signed)
[facility] [address] [place], [place], [address] Phone: [phone]   Fax:  [phone]  Physical Therapy Treatment  Patient Details  Name: Tracy Evans MRN: [id number] Date of Birth: 10-15-1985 Referring Provider (PT): Janyth Contes, MD   Encounter Date: 08/30/2019   PT End of Session - 08/30/19 1232    Visit Number 2    Date for PT Re-Evaluation 11/15/19    PT Start Time 1147    PT Stop Time 1227    PT Time Calculation (min) 40 min    Activity Tolerance Patient tolerated treatment well    Behavior During Therapy [facility] for tasks assessed/performed           Past Medical History:  Diagnosis Date  . GERD (gastroesophageal reflux disease)   . H/O cesarean section complicating pregnancy 07/10/6293  . Headache   . Status post repeat low transverse cesarean section 05/07/2019    Past Surgical History:  Procedure Laterality Date  . APPENDECTOMY    . CESAREAN SECTION    . CESAREAN SECTION N/A 02/09/2018   Procedure: REPEAT CESAREAN SECTION;  Surgeon: Janyth Contes, MD;  Location: [place];  Service: Obstetrics;  Laterality: N/A;  [name],  RNFA  . CESAREAN SECTION N/A 05/07/2019   Procedure: CESAREAN SECTION;  Surgeon: Janyth Contes, MD;  Location: [facility];  Service: Obstetrics;  Laterality: N/A;  [name],  RNFA    There were no vitals filed for this visit.   Subjective Assessment - 08/30/19 1304    Subjective Everything is good but the back pain is not.    Currently in Pain? Yes    Pain Score 6     Pain Location Back    Pain Orientation Right;Lower    Pain Descriptors / Indicators Sore    Pain Type Chronic pain    Pain Onset More than a month ago    Multiple Pain Sites No                             [facility] Adult PT Treatment/Exercise - 08/30/19 0001      Self-Care   Self-Care --      Exercises   Exercises Lumbar      Lumbar Exercises:  Stretches   Double Knee to Chest Stretch 2 reps    Lower Trunk Rotation 3 reps    Press Ups 2 reps    Piriformis Stretch 2 reps    Other Lumbar Stretch Exercise all stretches as listed in HEP educated and performed      Lumbar Exercises: Supine   Bent Knee Raise 10 reps      Lumbar Exercises: Quadruped   Single Arm Raise 5 reps    Other Quadruped Lumbar Exercises TrA activation      Manual Therapy   Manual therapy comments thoracic and lumbar paraspinals            Trigger Point Dry Needling - 08/30/19 0001    Consent Given? Yes    Education Handout Provided Yes    Muscles Treated Back/Hip Lumbar multifidi;Thoracic multifidi    Lumbar multifidi Response Twitch response elicited;Palpable increased muscle length    Thoracic multifidi response Twitch response elicited;Palpable increased muscle length   T10-12               PT Education - 08/30/19 1232    Education Details Access Code: [email or web address]    Person(s) Educated Patient  Methods Explanation;Demonstration;Verbal cues;Handout;Tactile cues    Comprehension Verbalized understanding;Returned demonstration            PT Short Term Goals - 07/26/19 1609      PT SHORT TERM GOAL #1   Title pt will be ind with initial HEP    Time 6    Period Weeks    Status New    Target Date 09/06/19      PT SHORT TERM GOAL #2   Title Pt will be able to hold sub max pelvic floor contraction for 60 seconds in order to demonstrate good endurance for running or longer walks    Time 6    Period Weeks    Status New    Target Date 09/06/19      PT SHORT TERM GOAL #3   Title Pt will be able to walk for >2 miles due to improved core strength    Time 6    Period Weeks    Status New    Target Date 09/06/19             PT Long Term Goals - 07/26/19 1242      PT LONG TERM GOAL #1   Title Be able to run 30 minutes and/or do yoga class without increased    Time 16    Period Weeks    Status New    Target Date 11/15/19       PT LONG TERM GOAL #2   Title Pt will be able to lift and carry 20lb with improved core activation for at least 60% reduced pain    Time 16    Period Weeks    Status New    Target Date 11/15/19      PT LONG TERM GOAL #3   Title Pt will report only 20% limitations due to pelvic related pain due to improved posture and core    Baseline 60% limited    Time 16    Period Weeks    Status New    Target Date 11/15/19      PT LONG TERM GOAL #4   Title Pt will be ind with advnaced HEP    Time 16    Period Weeks    Status New    Target Date 11/15/19      PT LONG TERM GOAL #5   Title Pt will be able to perform single leg hop 10x without pain or leakage bilateral for return to running    Time 16    Period Weeks    Status New    Target Date 11/15/19                 Plan - 08/30/19 1306    Clinical Impression Statement Pt responded well to dry needling and soft tissue release to thoracic and lumbar multifidi/paraspinals.  Pt felt a pop with thoracic rotation stretch after the manual treatment.  Pt able to activate the TrA with tactile cues.  She was given HEP with basic core strengthening today.  No goals met yet as this was the initial treatment    PT Treatment/Interventions ADLs/Self Care Home Management;Biofeedback;Cryotherapy;Electrical Stimulation;Moist Heat;Neuromuscular re-education;Therapeutic exercise;Therapeutic activities;Patient/family education;Manual techniques;Dry needling;Passive range of motion;Taping    PT Next Visit Plan f/u on DN and do again as needed; scar massage to abdominal (cupping?), h/s stretches, glute and core strength    Consulted and Agree with Plan of Care Patient           Patient  will benefit from skilled therapeutic intervention in order to improve the following deficits and impairments:  Pain, Postural dysfunction, Decreased strength, Impaired flexibility, Increased muscle spasms, Decreased coordination, Decreased range of motion  Visit  Diagnosis: Muscle weakness (generalized)  Abnormal posture  Unspecified lack of coordination     Problem List Patient Active Problem List   Diagnosis Date Noted  . Status post repeat low transverse cesarean section 05/07/2019  . H/O cesarean section complicating pregnancy 96/72/8979    Jule Ser, PT 08/30/2019, 1:13 PM  [facility] [address] [place], [place], [address] Phone: [phone]   Fax:  [phone]  Name: Tracy Evans MRN: [id number] Date of Birth: 22-Sep-1985

## 2019-08-31 DIAGNOSIS — M546 Pain in thoracic spine: Secondary | ICD-10-CM | POA: Diagnosis not present

## 2019-08-31 DIAGNOSIS — Z0000 Encounter for general adult medical examination without abnormal findings: Secondary | ICD-10-CM | POA: Diagnosis not present

## 2019-08-31 DIAGNOSIS — M9902 Segmental and somatic dysfunction of thoracic region: Secondary | ICD-10-CM | POA: Diagnosis not present

## 2019-08-31 DIAGNOSIS — M791 Myalgia, unspecified site: Secondary | ICD-10-CM | POA: Diagnosis not present

## 2019-08-31 DIAGNOSIS — Z13220 Encounter for screening for lipoid disorders: Secondary | ICD-10-CM | POA: Diagnosis not present

## 2019-08-31 DIAGNOSIS — L659 Nonscarring hair loss, unspecified: Secondary | ICD-10-CM | POA: Diagnosis not present

## 2019-08-31 DIAGNOSIS — M9903 Segmental and somatic dysfunction of lumbar region: Secondary | ICD-10-CM | POA: Diagnosis not present

## 2019-08-31 DIAGNOSIS — M9901 Segmental and somatic dysfunction of cervical region: Secondary | ICD-10-CM | POA: Diagnosis not present

## 2019-09-03 ENCOUNTER — Ambulatory Visit: Payer: BC Managed Care – PPO | Attending: Obstetrics and Gynecology | Admitting: Physical Therapy

## 2019-09-03 ENCOUNTER — Other Ambulatory Visit: Payer: Self-pay

## 2019-09-03 ENCOUNTER — Encounter: Payer: Self-pay | Admitting: Physical Therapy

## 2019-09-03 DIAGNOSIS — R279 Unspecified lack of coordination: Secondary | ICD-10-CM | POA: Insufficient documentation

## 2019-09-03 DIAGNOSIS — R293 Abnormal posture: Secondary | ICD-10-CM | POA: Diagnosis not present

## 2019-09-03 DIAGNOSIS — M9902 Segmental and somatic dysfunction of thoracic region: Secondary | ICD-10-CM | POA: Diagnosis not present

## 2019-09-03 DIAGNOSIS — M546 Pain in thoracic spine: Secondary | ICD-10-CM | POA: Diagnosis not present

## 2019-09-03 DIAGNOSIS — M9901 Segmental and somatic dysfunction of cervical region: Secondary | ICD-10-CM | POA: Diagnosis not present

## 2019-09-03 DIAGNOSIS — M9903 Segmental and somatic dysfunction of lumbar region: Secondary | ICD-10-CM | POA: Diagnosis not present

## 2019-09-03 DIAGNOSIS — M6281 Muscle weakness (generalized): Secondary | ICD-10-CM | POA: Insufficient documentation

## 2019-09-03 NOTE — Therapy (Signed)
[facility] [address] [place], [place], [address] Phone: [phone]   Fax:  [phone]  Physical Therapy Treatment  Patient Details  Name: Tracy Evans MRN: [id number] Date of Birth: 1985/03/16 Referring Provider (PT): Sherian Rein, MD   Encounter Date: 09/03/2019   PT End of Session - 09/03/19 1234    Visit Number 3    Date for PT Re-Evaluation 11/15/19    PT Start Time 1145    PT Stop Time 1228    PT Time Calculation (min) 43 min    Activity Tolerance Patient tolerated treatment well    Behavior During Therapy [facility] for tasks assessed/performed           Past Medical History:  Diagnosis Date  . GERD (gastroesophageal reflux disease)   . H/O cesarean section complicating pregnancy 02/06/2018  . Headache   . Status post repeat low transverse cesarean section 05/07/2019    Past Surgical History:  Procedure Laterality Date  . APPENDECTOMY    . CESAREAN SECTION    . CESAREAN SECTION N/A 02/09/2018   Procedure: REPEAT CESAREAN SECTION;  Surgeon: Sherian Rein, MD;  [facility];  Service: Obstetrics;  Laterality: N/A;  [name],  RNFA  . CESAREAN SECTION N/A 05/07/2019   Procedure: CESAREAN SECTION;  Surgeon: Sherian Rein, MD;  Location: [facility];  Service: Obstetrics;  Laterality: N/A;  [name],  RNFA    There were no vitals filed for this visit.   Subjective Assessment - 09/03/19 1254    Subjective I felt better after DN and could breathe better.  Back pain is a little better.    Patient Stated Goals decrease back pain; get back to a little running    Currently in Pain? No/denies                             [facility] Adult PT Treatment/Exercise - 09/03/19 0001      Lumbar Exercises: Supine   Ab Set 10 reps    Bent Knee Raise 10 reps    Bent Knee Raise Limitations adde alt UE     Bridge 15 reps    Bridge Limitations blue loop clams    Bridge  with Ball Squeeze Limitations ball squeeze without bridge just engaging pelvic floor and core      Manual Therapy   Manual therapy comments thoracic and lumbar paraspinals; intercostals bilat            Trigger Point Dry Needling - 09/03/19 0001    Consent Given? Yes    Education Handout Provided Previously provided    Muscles Treated Back/Hip Lumbar multifidi;Thoracic multifidi    Lumbar multifidi Response Twitch response elicited;Palpable increased muscle length    Thoracic multifidi response Twitch response elicited;Palpable increased muscle length   T10-12                 PT Short Term Goals - 09/03/19 1235      PT SHORT TERM GOAL #1   Title pt will be ind with initial HEP    Status Achieved             PT Long Term Goals - 07/26/19 1242      PT LONG TERM GOAL #1   Title Be able to run 30 minutes and/or do yoga class without increased    Time 16    Period Weeks    Status New  Target Date 11/15/19      PT LONG TERM GOAL #2   Title Pt will be able to lift and carry 20lb with improved core activation for at least 60% reduced pain    Time 16    Period Weeks    Status New    Target Date 11/15/19      PT LONG TERM GOAL #3   Title Pt will report only 20% limitations due to pelvic related pain due to improved posture and core    Baseline 60% limited    Time 16    Period Weeks    Status New    Target Date 11/15/19      PT LONG TERM GOAL #4   Title Pt will be ind with advnaced HEP    Time 16    Period Weeks    Status New    Target Date 11/15/19      PT LONG TERM GOAL #5   Title Pt will be able to perform single leg hop 10x without pain or leakage bilateral for return to running    Time 16    Period Weeks    Status New    Target Date 11/15/19                 Plan - 09/03/19 1235    Clinical Impression Statement Pt is doing well with intial HEP.  Pt had good response from DN#1 and felt like she could breathe better and move better.   Things have since tense back up a little.  Pt did well with release from DN and STM.  She was able to progress core strengthening exercises.  Pt will benefit from skilled PT to work on improved core strength for reduces overuse of low back and pelvic floor.    PT Treatment/Interventions ADLs/Self Care Home Management;Biofeedback;Cryotherapy;Electrical Stimulation;Moist Heat;Neuromuscular re-education;Therapeutic exercise;Therapeutic activities;Patient/family education;Manual techniques;Dry needling;Passive range of motion;Taping    PT Next Visit Plan focus on solid HEP pt going away for a month; add h/s stretch and basic pelvic floor exercises    Consulted and Agree with Plan of Care Patient           Patient will benefit from skilled therapeutic intervention in order to improve the following deficits and impairments:  Pain, Postural dysfunction, Decreased strength, Impaired flexibility, Increased muscle spasms, Decreased coordination, Decreased range of motion  Visit Diagnosis: Muscle weakness (generalized)  Abnormal posture  Unspecified lack of coordination     Problem List Patient Active Problem List   Diagnosis Date Noted  . Status post repeat low transverse cesarean section 05/07/2019  . H/O cesarean section complicating pregnancy 02/06/2018    Junious Silk, PT 09/03/2019, 12:56 PM  [facility] [address], [address] [place], [place], [address] Phone: [phone]   Fax:  [phone]  Name: Tracy Evans MRN: [id number] Date of Birth: March 15, 1985

## 2019-09-05 ENCOUNTER — Other Ambulatory Visit: Payer: Self-pay

## 2019-09-05 ENCOUNTER — Ambulatory Visit: Payer: BC Managed Care – PPO | Admitting: Physical Therapy

## 2019-09-05 ENCOUNTER — Encounter: Payer: Self-pay | Admitting: Physical Therapy

## 2019-09-05 DIAGNOSIS — R293 Abnormal posture: Secondary | ICD-10-CM

## 2019-09-05 DIAGNOSIS — M6281 Muscle weakness (generalized): Secondary | ICD-10-CM | POA: Diagnosis not present

## 2019-09-05 DIAGNOSIS — R279 Unspecified lack of coordination: Secondary | ICD-10-CM

## 2019-09-05 NOTE — Therapy (Signed)
[facility] [address], [address] [place], [place], [address] Phone: [phone]   Fax:  [phone]  Physical Therapy Treatment  Patient Details  Name: Tracy Evans MRN: [id number] Date of Birth: 08-02-1985 Referring Provider (PT): Sherian Rein, MD   Encounter Date: 09/05/2019   PT End of Session - 09/05/19 1233    Visit Number 4    Date for PT Re-Evaluation 11/15/19    PT Start Time 1231    PT Stop Time 1307    PT Time Calculation (min) 36 min    Activity Tolerance Patient tolerated treatment well    Behavior During Therapy [facility] for tasks assessed/performed           Past Medical History:  Diagnosis Date  . GERD (gastroesophageal reflux disease)   . H/O cesarean section complicating pregnancy 02/06/2018  . Headache   . Status post repeat low transverse cesarean section 05/07/2019    Past Surgical History:  Procedure Laterality Date  . APPENDECTOMY    . CESAREAN SECTION    . CESAREAN SECTION N/A 02/09/2018   Procedure: REPEAT CESAREAN SECTION;  Surgeon: Sherian Rein, MD;  [facility]: [facility];  Service: Obstetrics;  Laterality: N/A;  [name],  RNFA  . CESAREAN SECTION N/A 05/07/2019   Procedure: CESAREAN SECTION;  Surgeon: Sherian Rein, MD;  [facility];  Service: Obstetrics;  Laterality: N/A;  [name],  RNFA    There were no vitals filed for this visit.   Subjective Assessment - 09/05/19 1320    Subjective I feel a little pinchy but yesterday felt great.  Pt states exercises at home going well    Patient Stated Goals decrease back pain; get back to a little running    Currently in Pain? No/denies                             [facility] Adult PT Treatment/Exercise - 09/05/19 0001      Lumbar Exercises: Seated   Sit to Stand 20 reps   with hip hinge dowel and brace core/pelvic floor   Other Seated Lumbar Exercises hip hinge mini squat with dowel - 10x        Lumbar Exercises: Supine   Ab Set 10 reps;5 seconds   LE 90-90 UE overhead hold     Lumbar Exercises: Quadruped   Other Quadruped Lumbar Exercises on ball - stretch fwd; UE and LE reaches - cues to activate TrA      Manual Therapy   Manual therapy comments Lt hip flexor/psoas release            Trigger Point Dry Needling - 09/05/19 0001    Muscles Treated Back/Hip Iliopsoas    Iliopsoas Response Twitch response elicited;Palpable increased muscle length                  PT Short Term Goals - 09/05/19 1310      PT SHORT TERM GOAL #1   Title pt will be ind with initial HEP    Status Achieved      PT SHORT TERM GOAL #2   Title Pt will be able to hold sub max pelvic floor contraction for 60 seconds in order to demonstrate good endurance for running or longer walks    Baseline began to work on activation of pelvic floor    Status On-going      PT SHORT TERM GOAL #3   Title  Pt will be able to walk for >2 miles due to improved core strength    Status On-going             PT Long Term Goals - 07/26/19 1242      PT LONG TERM GOAL #1   Title Be able to run 30 minutes and/or do yoga class without increased    Time 16    Period Weeks    Status New    Target Date 11/15/19      PT LONG TERM GOAL #2   Title Pt will be able to lift and carry 20lb with improved core activation for at least 60% reduced pain    Time 16    Period Weeks    Status New    Target Date 11/15/19      PT LONG TERM GOAL #3   Title Pt will report only 20% limitations due to pelvic related pain due to improved posture and core    Baseline 60% limited    Time 16    Period Weeks    Status New    Target Date 11/15/19      PT LONG TERM GOAL #4   Title Pt will be ind with advnaced HEP    Time 16    Period Weeks    Status New    Target Date 11/15/19      PT LONG TERM GOAL #5   Title Pt will be able to perform single leg hop 10x without pain or leakage bilateral for return to running     Time 16    Period Weeks    Status New    Target Date 11/15/19                 Plan - 09/05/19 1307    Clinical Impression Statement Today's session focused on coordination of core and pelvic floor.  Noticed that she had difficulty with hip extension without getting a lot of ilium anterior rotation in the Lt side of the pelvis.  Pt did well with hip flexor stretches and had good release of hip flexors with STM and DN.  She began working on pelvic floor activation during exercises. Needs cue to maintain    PT Treatment/Interventions ADLs/Self Care Home Management;Biofeedback;Cryotherapy;Electrical Stimulation;Moist Heat;Neuromuscular re-education;Therapeutic exercise;Therapeutic activities;Patient/family education;Manual techniques;Dry needling;Passive range of motion;Taping    PT Next Visit Plan focus on solid HEP pt going away for a month; add h/s stretch and basic pelvic floor exercises    Consulted and Agree with Plan of Care Patient           Patient will benefit from skilled therapeutic intervention in order to improve the following deficits and impairments:  Pain, Postural dysfunction, Decreased strength, Impaired flexibility, Increased muscle spasms, Decreased coordination, Decreased range of motion  Visit Diagnosis: Muscle weakness (generalized)  Abnormal posture  Unspecified lack of coordination     Problem List Patient Active Problem List   Diagnosis Date Noted  . Status post repeat low transverse cesarean section 05/07/2019  . H/O cesarean section complicating pregnancy 02/06/2018    Junious Silk, PT 09/05/2019, 2:29 PM   [facility] [address] [place], [place], [address] Phone: [phone]   Fax:  [phone]  Name: Tracy Evans MRN: [id number] Date of Birth: Mar 06, 1985

## 2019-09-06 DIAGNOSIS — Z09 Encounter for follow-up examination after completed treatment for conditions other than malignant neoplasm: Secondary | ICD-10-CM | POA: Diagnosis not present

## 2019-09-06 DIAGNOSIS — G43909 Migraine, unspecified, not intractable, without status migrainosus: Secondary | ICD-10-CM | POA: Diagnosis not present

## 2019-10-10 ENCOUNTER — Encounter: Payer: BC Managed Care – PPO | Admitting: Physical Therapy

## 2019-10-11 DIAGNOSIS — D225 Melanocytic nevi of trunk: Secondary | ICD-10-CM | POA: Diagnosis not present

## 2019-10-11 DIAGNOSIS — L814 Other melanin hyperpigmentation: Secondary | ICD-10-CM | POA: Diagnosis not present

## 2019-10-11 DIAGNOSIS — Z808 Family history of malignant neoplasm of other organs or systems: Secondary | ICD-10-CM | POA: Diagnosis not present

## 2019-10-11 DIAGNOSIS — L578 Other skin changes due to chronic exposure to nonionizing radiation: Secondary | ICD-10-CM | POA: Diagnosis not present

## 2019-10-12 ENCOUNTER — Ambulatory Visit: Payer: BC Managed Care – PPO | Attending: Obstetrics and Gynecology | Admitting: Physical Therapy

## 2019-10-12 ENCOUNTER — Other Ambulatory Visit: Payer: Self-pay

## 2019-10-12 DIAGNOSIS — R279 Unspecified lack of coordination: Secondary | ICD-10-CM | POA: Diagnosis not present

## 2019-10-12 DIAGNOSIS — M6281 Muscle weakness (generalized): Secondary | ICD-10-CM | POA: Insufficient documentation

## 2019-10-12 DIAGNOSIS — M546 Pain in thoracic spine: Secondary | ICD-10-CM | POA: Diagnosis not present

## 2019-10-12 DIAGNOSIS — M9901 Segmental and somatic dysfunction of cervical region: Secondary | ICD-10-CM | POA: Diagnosis not present

## 2019-10-12 DIAGNOSIS — M9902 Segmental and somatic dysfunction of thoracic region: Secondary | ICD-10-CM | POA: Diagnosis not present

## 2019-10-12 DIAGNOSIS — R293 Abnormal posture: Secondary | ICD-10-CM | POA: Diagnosis not present

## 2019-10-12 DIAGNOSIS — M9903 Segmental and somatic dysfunction of lumbar region: Secondary | ICD-10-CM | POA: Diagnosis not present

## 2019-10-13 ENCOUNTER — Encounter: Payer: Self-pay | Admitting: Physical Therapy

## 2019-10-13 NOTE — Therapy (Signed)
[facility] [address] [place], [place], [address] Phone: [phone]   Fax:  [phone]  Physical Therapy Treatment  Patient Details  Name: Tracy Evans MRN: [id number] Date of Birth: 10-08-1985 Referring Provider (PT): Sherian Rein, MD   Encounter Date: 10/12/2019   PT End of Session - 10/13/19 2024    Visit Number 5    Date for PT Re-Evaluation 11/15/19    PT Start Time 1102    PT Stop Time 1143    PT Time Calculation (min) 41 min    Activity Tolerance Patient tolerated treatment well    Behavior During Therapy [facility] for tasks assessed/performed           Past Medical History:  Diagnosis Date  . GERD (gastroesophageal reflux disease)   . H/O cesarean section complicating pregnancy 02/06/2018  . Headache   . Status post repeat low transverse cesarean section 05/07/2019    Past Surgical History:  Procedure Laterality Date  . APPENDECTOMY    . CESAREAN SECTION    . CESAREAN SECTION N/A 02/09/2018   Procedure: REPEAT CESAREAN SECTION;  Surgeon: Sherian Rein, MD;  [facility]: [facility];  Service: Obstetrics;  Laterality: N/A;  [name],  RNFA  . CESAREAN SECTION N/A 05/07/2019   Procedure: CESAREAN SECTION;  Surgeon: Sherian Rein, MD;  [facility]: [facility];  Service: Obstetrics;  Laterality: N/A;  [name],  RNFA    There were no vitals filed for this visit.   Subjective Assessment - 10/13/19 2028    Subjective I felt good with the exercises on vacation.  Just sore when I sit, I have to move around a lot.    How long can you walk comfortably? 1.5-2 miles    Patient Stated Goals decrease back pain; get back to a little running    Currently in Pain? No/denies                             [facility] Adult PT Treatment/Exercise - 10/13/19 0001      Manual Therapy   Manual Therapy Soft tissue mobilization;Myofascial release;Internal Pelvic Floor    Manual  therapy comments thoracic and lumbar paraspinals; intercostals bilat    Internal Pelvic Floor pt identity confirmed and internal soft tissue release performed rectally - levators coccygeus                    PT Short Term Goals - 10/12/19 1103      PT SHORT TERM GOAL #3   Title Pt will be able to walk for >2 miles due to improved core strength    Baseline 1-2 miles    Status Achieved             PT Long Term Goals - 10/12/19 1104      PT LONG TERM GOAL #1   Title Be able to run 30 minutes and/or do yoga class without increased pain    Status On-going      PT LONG TERM GOAL #2   Title Pt will be able to lift and carry 20lb with improved core activation for at least 60% reduced pain    Baseline still pain every once in a while, 40-50% better    Status On-going      PT LONG TERM GOAL #3   Title Pt will report only 20% limitations due to pelvic related pain due to improved posture and core  Baseline 50-40% limited and it is due to not being able get back to yoga and running    Status On-going      PT LONG TERM GOAL #4   Title Pt will be ind with advnaced HEP    Status On-going                 Plan - 10/13/19 2024    Clinical Impression Statement Today was first session back from vacation and pt was still feeling a little more mobility in the spine.  Today's session focused on continued work on lumbar soft tissue release.  Today also worked on pelvic floor done rectally. Pt felt less soreness after treatment when she sat up.  Will benefit from continue to work on soft tissue release for reduction of pain.    PT Treatment/Interventions ADLs/Self Care Home Management;Biofeedback;Cryotherapy;Electrical Stimulation;Moist Heat;Neuromuscular re-education;Therapeutic exercise;Therapeutic activities;Patient/family education;Manual techniques;Dry needling;Passive range of motion;Taping    PT Next Visit Plan f/u on internal and pain level; internal STM as needed lumbar  and thoracic    Consulted and Agree with Plan of Care Patient           Patient will benefit from skilled therapeutic intervention in order to improve the following deficits and impairments:  Pain, Postural dysfunction, Decreased strength, Impaired flexibility, Increased muscle spasms, Decreased coordination, Decreased range of motion  Visit Diagnosis: Muscle weakness (generalized)  Abnormal posture  Unspecified lack of coordination     Problem List Patient Active Problem List   Diagnosis Date Noted  . Status post repeat low transverse cesarean section 05/07/2019  . H/O cesarean section complicating pregnancy 02/06/2018    Junious Silk, PT 10/13/2019, 8:29 PM  [facility] [address] [place], [place], [address] Phone: [phone]   Fax:  [phone]  Name: Tracy Evans MRN: [id number] Date of Birth: August 22, 1985

## 2019-10-15 ENCOUNTER — Ambulatory Visit: Payer: BC Managed Care – PPO | Admitting: Physical Therapy

## 2019-10-15 ENCOUNTER — Other Ambulatory Visit: Payer: Self-pay

## 2019-10-15 ENCOUNTER — Encounter: Payer: Self-pay | Admitting: Physical Therapy

## 2019-10-15 DIAGNOSIS — R279 Unspecified lack of coordination: Secondary | ICD-10-CM

## 2019-10-15 DIAGNOSIS — M6281 Muscle weakness (generalized): Secondary | ICD-10-CM

## 2019-10-15 DIAGNOSIS — R293 Abnormal posture: Secondary | ICD-10-CM

## 2019-10-15 NOTE — Therapy (Signed)
[facility] [address], [address] [place], [place], [address] Phone: [phone]   Fax:  [phone]  Physical Therapy Treatment  Patient Details  Name: Tracy Evans MRN: [id number] Date of Birth: [birth year] Referring Provider (PT): Sherian Rein, MD   Encounter Date: 10/15/2019   PT End of Session - 10/15/19 1402    Visit Number 6    Date for PT Re-Evaluation 11/15/19    PT Start Time 1402    PT Stop Time 1443    PT Time Calculation (min) 41 min    Activity Tolerance Patient tolerated treatment well           Past Medical History:  Diagnosis Date  . GERD (gastroesophageal reflux disease)   . H/O cesarean section complicating pregnancy 02/06/2018  . Headache   . Status post repeat low transverse cesarean section 05/07/2019    Past Surgical History:  Procedure Laterality Date  . APPENDECTOMY    . CESAREAN SECTION    . CESAREAN SECTION N/A 02/09/2018   Procedure: REPEAT CESAREAN SECTION;  Surgeon: Sherian Rein, MD;  [facility];  Service: Obstetrics;  Laterality: N/A;  [name],  RNFA  . CESAREAN SECTION N/A 05/07/2019   Procedure: CESAREAN SECTION;  Surgeon: Sherian Rein, MD;  [facility];  Service: Obstetrics;  Laterality: N/A;  [name],  RNFA    There were no vitals filed for this visit.   Subjective Assessment - 10/15/19 1404    Subjective I was sore for a day after the soft tissue work, maybe is a little better now.  It is only sore right when I sit. (see below details)    Currently in Pain? Yes    Pain Score 8    just when first sitting down   Pain Location Coccyx    Pain Orientation Mid    Pain Descriptors / Indicators Sore    Pain Type Chronic pain    Aggravating Factors  just the initial sitting    Multiple Pain Sites No                             [facility] Adult PT Treatment/Exercise - 10/15/19 0001      Lumbar Exercises: Stretches     Hip Flexor Stretch Limitations side lying off mat stretch for hip flexor/TFL - 3x30 sec      Lumbar Exercises: Standing   Functional Squats Limitations squat with blue band press out - 10x each side    Side Lunge Limitations lunge with slider - curtsy, diagonal, fwd - 10x each side    Shoulder Adduction Limitations diagonals 20# on power tower in half kneeling position - 15x each side    Other Standing Lumbar Exercises pallof press 20# in kneeling on foam pad - 15x each side      Lumbar Exercises: Quadruped   Other Quadruped Lumbar Exercises on forearms - heavy TC to keep from hiking hip - 5 LE donkey kicks on each side                  PT Education - 10/15/19 1450    Education Details Access Code: [email or web address]    Person(s) Educated Patient    Methods Explanation;Demonstration;Verbal cues;Handout    Comprehension Verbalized understanding;Returned demonstration            PT Short Term Goals - 10/12/19 1103      PT SHORT TERM GOAL #3  Title Pt will be able to walk for >2 miles due to improved core strength    Baseline 1-2 miles    Status Achieved             PT Long Term Goals - 10/12/19 1104      PT LONG TERM GOAL #1   Title Be able to run 30 minutes and/or do yoga class without increased pain    Status On-going      PT LONG TERM GOAL #2   Title Pt will be able to lift and carry 20lb with improved core activation for at least 60% reduced pain    Baseline still pain every once in a while, 40-50% better    Status On-going      PT LONG TERM GOAL #3   Title Pt will report only 20% limitations due to pelvic related pain due to improved posture and core    Baseline 50-40% limited and it is due to not being able get back to yoga and running    Status On-going      PT LONG TERM GOAL #4   Title Pt will be ind with advnaced HEP    Status On-going                 Plan - 10/15/19 1446    Clinical Impression Statement Pt did well with exercise progressions  today.  Added some bouncing on the rebounder and focus placed on more single sided exercises for balanced core control  She needs cues to prevent anterior pelvic collapse on Rt and hip hiking on the left side.  Pt will benefit from skilled PT to continue to improved strength and posture with funcitonal movements as well as soft tissue releas in Rt hip flexor,TFL, Lt QL and pelvic floor.    PT Treatment/Interventions ADLs/Self Care Home Management;Biofeedback;Cryotherapy;Electrical Stimulation;Moist Heat;Neuromuscular re-education;Therapeutic exercise;Therapeutic activities;Patient/family education;Manual techniques;Dry needling;Passive range of motion;Taping    PT Next Visit Plan STM Rt hip flexor,TFL, Lt QL and pelvic floor. lunge and pallof progression, single leg    PT Home Exercise Plan Access Code: [email or web address]    Consulted and Agree with Plan of Care Patient           Patient will benefit from skilled therapeutic intervention in order to improve the following deficits and impairments:  Pain, Postural dysfunction, Decreased strength, Impaired flexibility, Increased muscle spasms, Decreased coordination, Decreased range of motion  Visit Diagnosis: Muscle weakness (generalized)  Abnormal posture  Unspecified lack of coordination     Problem List Patient Active Problem List   Diagnosis Date Noted  . Status post repeat low transverse cesarean section 05/07/2019  . H/O cesarean section complicating pregnancy 02/06/2018    Junious Silk, PT 10/15/2019, 3:29 PM  [place] [address] [place], [place], [address] Phone: [phone]   Fax:  [phone]  Name: Tracy Evans MRN: [id number] Date of Birth: 11-13-1985

## 2019-10-15 NOTE — Patient Instructions (Signed)
[email or web address] Code: [email or web address] URL: [email or web address] Date: 10/15/2019 Prepared by: Dwana Curd  Exercises Supine Hip Internal and External Rotation - 1 x daily - 7 x weekly - 10 reps - 1 sets - 5 sec hold Thoracic Extension Mobilization with Noodle - 1 x daily - 7 x weekly - 1 sets - 10 reps - 10 sec hold Cat-Camel to Child's Pose - 1 x daily - 7 x weekly - 5 reps - 1 sets - 10 sec hold Supine Pelvic Floor Stretch - 1 x daily - 7 x weekly - 3 reps - 1 sets - 30 sec hold Static Prone on Elbows - 1 x daily - 7 x weekly - 3 sets - 10 reps Hooklying Small March - 1 x daily - 7 x weekly - 10 reps - 2 sets Quadruped Alternating Arm Lift - 1 x daily - 7 x weekly - 10 reps - 2 sets Supine Bridge with Resistance Band - 1 x daily - 7 x weekly - 2 sets - 10 reps - 3 hold Beginner Clam - 1 x daily - 7 x weekly - 2 sets - 10 reps - 3 hold Supine 90/90 Shoulder Flexion with Abdominal Bracing - 1 x daily - 7 x weekly - 3 sets - 10 reps Standing Hip Hinge with Dowel - 1 x daily - 7 x weekly - 3 sets - 10 reps Prone Hip Flexor Stretch on Table with Strap - 1 x daily - 7 x weekly - 3 sets - 10 reps Half Kneeling Hip Flexor Stretch with Sidebend - 1 x daily - 7 x weekly - 3 sets - 10 reps Sidelying ITB Stretch off Table - 1 x daily - 7 x weekly - 3 sets - 10 reps Squatting Anti-Rotation Press - 1 x daily - 7 x weekly - 3 sets - 10 reps

## 2019-10-17 ENCOUNTER — Other Ambulatory Visit: Payer: Self-pay

## 2019-10-17 ENCOUNTER — Ambulatory Visit: Payer: BC Managed Care – PPO | Admitting: Physical Therapy

## 2019-10-17 ENCOUNTER — Encounter: Payer: Self-pay | Admitting: Physical Therapy

## 2019-10-17 DIAGNOSIS — R279 Unspecified lack of coordination: Secondary | ICD-10-CM | POA: Diagnosis not present

## 2019-10-17 DIAGNOSIS — M6281 Muscle weakness (generalized): Secondary | ICD-10-CM | POA: Diagnosis not present

## 2019-10-17 DIAGNOSIS — R293 Abnormal posture: Secondary | ICD-10-CM | POA: Diagnosis not present

## 2019-10-17 NOTE — Therapy (Signed)
[facility] [address] [place], [place], [address] Phone: [phone]   Fax:  [phone]  Physical Therapy Treatment  Patient Details  Name: Tracy Evans MRN: [id number] Date of Birth: [birth year] Referring Provider (PT): Sherian Rein, MD   Encounter Date: 10/17/2019   PT End of Session - 10/17/19 1408    Visit Number 8    Date for PT Re-Evaluation 11/15/19    PT Start Time 1403    PT Stop Time 1443    PT Time Calculation (min) 40 min    Activity Tolerance Patient tolerated treatment well    Behavior During Therapy [facility] for tasks assessed/performed           Past Medical History:  Diagnosis Date  . GERD (gastroesophageal reflux disease)   . H/O cesarean section complicating pregnancy 02/06/2018  . Headache   . Status post repeat low transverse cesarean section 05/07/2019    Past Surgical History:  Procedure Laterality Date  . APPENDECTOMY    . CESAREAN SECTION    . CESAREAN SECTION N/A 02/09/2018   Procedure: REPEAT CESAREAN SECTION;  Surgeon: Sherian Rein, MD;  [facility];  Service: Obstetrics;  Laterality: N/A;  [name],  RNFA  . CESAREAN SECTION N/A 05/07/2019   Procedure: CESAREAN SECTION;  Surgeon: Sherian Rein, MD;  [facility]: [facility];  Service: Obstetrics;  Laterality: N/A;  [name],  RNFA    There were no vitals filed for this visit.   Subjective Assessment - 10/17/19 1406    Subjective I went for a long walk almost 3 miles yesterday and just sore in pubic symphasis after but it went away.    Patient Stated Goals decrease back pain; get back to a little running    Currently in Pain? No/denies                             [facility] Adult PT Treatment/Exercise - 10/17/19 0001      Neuro Re-ed    Neuro Re-ed Details  jumping with soft landing double leg; side to side; dynamic warm up lunge walking rotation, SLS with yellow band pulses       Lumbar Exercises: Stretches   Active Hamstring Stretch Right;Left;2 reps;30 seconds      Lumbar Exercises: Aerobic   Elliptical L1 - Pt present for status update      Lumbar Exercises: Standing   Lifting 20 reps;From 12"    Lifting Weights (lbs) 10    Lifting Limitations dead lift    Other Standing Lumbar Exercises pallof press red single leg    Other Standing Lumbar Exercises bird dips 10x each side; walking lunge rotation 10x each side      Knee/Hip Exercises: Standing   SLS with Vectors sliders back, diagonal, side - 10 each way    Rebounder bouncing 3 ways - 1 min each                    PT Short Term Goals - 10/12/19 1103      PT SHORT TERM GOAL #3   Title Pt will be able to walk for >2 miles due to improved core strength    Baseline 1-2 miles    Status Achieved             PT Long Term Goals - 10/12/19 1104      PT LONG TERM GOAL #1  Title Be able to run 30 minutes and/or do yoga class without increased pain    Status On-going      PT LONG TERM GOAL #2   Title Pt will be able to lift and carry 20lb with improved core activation for at least 60% reduced pain    Baseline still pain every once in a while, 40-50% better    Status On-going      PT LONG TERM GOAL #3   Title Pt will report only 20% limitations due to pelvic related pain due to improved posture and core    Baseline 50-40% limited and it is due to not being able get back to yoga and running    Status On-going      PT LONG TERM GOAL #4   Title Pt will be ind with advnaced HEP    Status On-going                 Plan - 10/17/19 1444    Clinical Impression Statement Pt is getting better endurance and was able to walk .  Pt porgressing well with single leg exercises today.  She is doing much better with hip hinge and able to perform these correctly adding 10lb to dead lifting today.  Pt will benefit from skilled PT to continue to work on strength and endurnace for  returning to running.    PT Treatment/Interventions ADLs/Self Care Home Management;Biofeedback;Cryotherapy;Electrical Stimulation;Moist Heat;Neuromuscular re-education;Therapeutic exercise;Therapeutic activities;Patient/family education;Manual techniques;Dry needling;Passive range of motion;Taping    PT Next Visit Plan jumping and dynamic exercise progressions; if needed STM Rt hip flexor,TFL, Lt QL and pelvic floor. lunge and pallof progression, single leg    PT Home Exercise Plan Access Code: [email or web address]    Consulted and Agree with Plan of Care Patient           Patient will benefit from skilled therapeutic intervention in order to improve the following deficits and impairments:  Pain, Postural dysfunction, Decreased strength, Impaired flexibility, Increased muscle spasms, Decreased coordination, Decreased range of motion  Visit Diagnosis: Muscle weakness (generalized)  Abnormal posture  Unspecified lack of coordination     Problem List Patient Active Problem List   Diagnosis Date Noted  . Status post repeat low transverse cesarean section 05/07/2019  . H/O cesarean section complicating pregnancy 02/06/2018    Junious Silk, PT 10/17/2019, 2:47 PM  [facility] [address], [address] [place], [place], [address] Phone: [phone]   Fax:  [phone]  Name: Tracy Evans MRN: [id number] Date of Birth: Aug 10, 1985

## 2019-10-24 ENCOUNTER — Encounter: Payer: Self-pay | Admitting: Physical Therapy

## 2019-10-24 ENCOUNTER — Other Ambulatory Visit: Payer: Self-pay

## 2019-10-24 ENCOUNTER — Ambulatory Visit: Payer: BC Managed Care – PPO | Admitting: Physical Therapy

## 2019-10-24 DIAGNOSIS — M9901 Segmental and somatic dysfunction of cervical region: Secondary | ICD-10-CM | POA: Diagnosis not present

## 2019-10-24 DIAGNOSIS — M9903 Segmental and somatic dysfunction of lumbar region: Secondary | ICD-10-CM | POA: Diagnosis not present

## 2019-10-24 DIAGNOSIS — R279 Unspecified lack of coordination: Secondary | ICD-10-CM | POA: Diagnosis not present

## 2019-10-24 DIAGNOSIS — M546 Pain in thoracic spine: Secondary | ICD-10-CM | POA: Diagnosis not present

## 2019-10-24 DIAGNOSIS — R293 Abnormal posture: Secondary | ICD-10-CM

## 2019-10-24 DIAGNOSIS — M9902 Segmental and somatic dysfunction of thoracic region: Secondary | ICD-10-CM | POA: Diagnosis not present

## 2019-10-24 DIAGNOSIS — M6281 Muscle weakness (generalized): Secondary | ICD-10-CM

## 2019-10-24 NOTE — Therapy (Signed)
[facility] [address] [place], [place], [address] Phone: [phone]   Fax:  [phone]  Physical Therapy Treatment  Patient Details  Name: Tracy Evans MRN: [id number] Date of Birth: 1985/06/08 Referring Provider (PT): Sherian Rein, MD   Encounter Date: 10/24/2019   PT End of Session - 10/24/19 1416    Visit Number 9    Date for PT Re-Evaluation 11/15/19    PT Start Time 1401    PT Stop Time 1443    PT Time Calculation (min) 42 min    Activity Tolerance Patient tolerated treatment well    Behavior During Therapy [facility] for tasks assessed/performed           Past Medical History:  Diagnosis Date  . GERD (gastroesophageal reflux disease)   . H/O cesarean section complicating pregnancy 02/06/2018  . Headache   . Status post repeat low transverse cesarean section 05/07/2019    Past Surgical History:  Procedure Laterality Date  . APPENDECTOMY    . CESAREAN SECTION    . CESAREAN SECTION N/A 02/09/2018   Procedure: REPEAT CESAREAN SECTION;  Surgeon: Sherian Rein, MD;  [facility];  Service: Obstetrics;  Laterality: N/A;  [name],  RNFA  . CESAREAN SECTION N/A 05/07/2019   Procedure: CESAREAN SECTION;  Surgeon: Sherian Rein, MD;  [facility];  Service: Obstetrics;  Laterality: N/A;  [name],  RNFA    There were no vitals filed for this visit.   Subjective Assessment - 10/24/19 1405    Subjective I did a lot of activities over the week and did well, felt good.    Patient Stated Goals decrease back pain; get back to a little running    Currently in Pain? No/denies                             [facility] Adult PT Treatment/Exercise - 10/24/19 0001      Neuro Re-ed    Neuro Re-ed Details  ladder drills double and single leg jump/hops fwd and side - 3 x each variation      Lumbar Exercises: Stretches   Active Hamstring Stretch Right;Left;2  reps;30 seconds      Lumbar Exercises: Aerobic   Elliptical L1 - PT present for status update      Lumbar Exercises: Standing   Other Standing Lumbar Exercises pallof press blue single leg- 10x each side    Other Standing Lumbar Exercises bird dip 10x each; step up BOSU with hip flex holding 2lb 20x each; step down 6" 20x each side      Lumbar Exercises: Supine   Other Supine Lumbar Exercises lying on foam roller - bent knee dropouts, marching, yellow band diagonals UE - 15 xeach         self care  - educated on small steps and flat foot when running and to see how it feels doing short bursts of running adding into the walks           PT Short Term Goals - 10/12/19 1103      PT SHORT TERM GOAL #3   Title Pt will be able to walk for >2 miles due to improved core strength    Baseline 1-2 miles    Status Achieved             PT Long Term Goals - 10/12/19 1104      PT LONG TERM  GOAL #1   Title Be able to run 30 minutes and/or do yoga class without increased pain    Status On-going      PT LONG TERM GOAL #2   Title Pt will be able to lift and carry 20lb with improved core activation for at least 60% reduced pain    Baseline still pain every once in a while, 40-50% better    Status On-going      PT LONG TERM GOAL #3   Title Pt will report only 20% limitations due to pelvic related pain due to improved posture and core    Baseline 50-40% limited and it is due to not being able get back to yoga and running    Status On-going      PT LONG TERM GOAL #4   Title Pt will be ind with advnaced HEP    Status On-going                 Plan - 10/24/19 1454    Clinical Impression Statement Pt did well with hopping and jumping progressions today.  Pt was looking more stable with single leg exercises.  Pt was educated on gait mechanics when running with stroller and recommended to take smaller steps.  Pt will benefit from skilled PT to progress into her exercise and  return to run routine    PT Treatment/Interventions ADLs/Self Care Home Management;Biofeedback;Cryotherapy;Electrical Stimulation;Moist Heat;Neuromuscular re-education;Therapeutic exercise;Therapeutic activities;Patient/family education;Manual techniques;Dry needling;Passive range of motion;Taping    PT Next Visit Plan f/u on running; core and hip strength, primal push ups, return to run handout    PT Home Exercise Plan Access Code: [email or web address]    Consulted and Agree with Plan of Care Patient           Patient will benefit from skilled therapeutic intervention in order to improve the following deficits and impairments:  Pain, Postural dysfunction, Decreased strength, Impaired flexibility, Increased muscle spasms, Decreased coordination, Decreased range of motion  Visit Diagnosis: Muscle weakness (generalized)  Abnormal posture  Unspecified lack of coordination     Problem List Patient Active Problem List   Diagnosis Date Noted  . Status post repeat low transverse cesarean section 05/07/2019  . H/O cesarean section complicating pregnancy 02/06/2018    Junious Silk, PT 10/24/2019, 3:02 PM   [facility] [address], [address] [place], [place], [address] Phone: [phone]   Fax:  [phone]  Name: Tracy Evans MRN: [id number] Date of Birth: 12/16/1985

## 2019-10-31 ENCOUNTER — Encounter: Payer: BC Managed Care – PPO | Admitting: Physical Therapy

## 2019-11-07 ENCOUNTER — Ambulatory Visit: Payer: BC Managed Care – PPO | Attending: Obstetrics and Gynecology | Admitting: Physical Therapy

## 2019-11-07 ENCOUNTER — Other Ambulatory Visit: Payer: Self-pay

## 2019-11-07 DIAGNOSIS — M6281 Muscle weakness (generalized): Secondary | ICD-10-CM | POA: Insufficient documentation

## 2019-11-07 DIAGNOSIS — M9903 Segmental and somatic dysfunction of lumbar region: Secondary | ICD-10-CM | POA: Diagnosis not present

## 2019-11-07 DIAGNOSIS — M9901 Segmental and somatic dysfunction of cervical region: Secondary | ICD-10-CM | POA: Diagnosis not present

## 2019-11-07 DIAGNOSIS — R293 Abnormal posture: Secondary | ICD-10-CM | POA: Diagnosis not present

## 2019-11-07 DIAGNOSIS — M546 Pain in thoracic spine: Secondary | ICD-10-CM | POA: Diagnosis not present

## 2019-11-07 DIAGNOSIS — R279 Unspecified lack of coordination: Secondary | ICD-10-CM | POA: Insufficient documentation

## 2019-11-07 DIAGNOSIS — M9902 Segmental and somatic dysfunction of thoracic region: Secondary | ICD-10-CM | POA: Diagnosis not present

## 2019-11-07 NOTE — Therapy (Signed)
[facility] [address], [address] [place], [place], [address] Phone: [phone]   Fax:  [phone]  Physical Therapy Treatment  Patient Details  Name: Tracy Evans MRN: [id number] Date of Birth: 06-24-1985 Referring Provider (PT): Sherian Rein, MD   Encounter Date: 11/07/2019   PT End of Session - 11/07/19 1429    Visit Number 10    Date for PT Re-Evaluation 12/19/19    PT Start Time 1402    PT Stop Time 1440    PT Time Calculation (min) 38 min    Activity Tolerance Patient tolerated treatment well    Behavior During Therapy [facility] for tasks assessed/performed           Past Medical History:  Diagnosis Date   GERD (gastroesophageal reflux disease)    H/O cesarean section complicating pregnancy 02/06/2018   Headache    Status post repeat low transverse cesarean section 05/07/2019    Past Surgical History:  Procedure Laterality Date   APPENDECTOMY     CESAREAN SECTION     CESAREAN SECTION N/A 02/09/2018   Procedure: REPEAT CESAREAN SECTION;  Surgeon: Sherian Rein, MD;  [facility];  Service: Obstetrics;  Laterality: N/AHerbert Seta,  RNFA   CESAREAN SECTION N/A 05/07/2019   Procedure: CESAREAN SECTION;  Surgeon: Sherian Rein, MD;  [facility];  Service: Obstetrics;  Laterality: N/A;  [name],  RNFA    There were no vitals filed for this visit.   Subjective Assessment - 11/07/19 1521    Subjective I did lifting and no pain.  Coccyx pain is better.  I have a lot of tension in my low back that has been there for 2 weeks since starting the running.    Patient Stated Goals decrease back pain; get back to a little running    Currently in Pain? No/denies   not pain just tension             [facility] PT Assessment - 11/07/19 0001      Assessment   Medical Diagnosis N81.89 (ICD-10-CM) - Other female genital prolapse    Referring Provider (PT) Bovard-Stuckert,  Jody, MD      Palpation   Palpation comment lumbar and hip flexors tight                         [facility] Adult PT Treatment/Exercise - 11/07/19 0001      Lumbar Exercises: Standing   Other Standing Lumbar Exercises lunges with rotation; thoracic rotation swinging; knee hug walking      Lumbar Exercises: Seated   Other Seated Lumbar Exercises circles and hamstring stretches on the pball      Manual Therapy   Manual therapy comments thoracic and lumbar paraspinals; intercostals bilat            Trigger Point Dry Needling - 11/07/19 0001    Lumbar multifidi Response Twitch response elicited;Palpable increased muscle length    Thoracic multifidi response Twitch response elicited;Palpable increased muscle length   T11/12                 PT Short Term Goals - 10/12/19 1103      PT SHORT TERM GOAL #3   Title Pt will be able to walk for >2 miles due to improved core strength    Baseline 1-2 miles    Status Achieved             PT  Long Term Goals - 11/07/19 1522      PT LONG TERM GOAL #1   Title Be able to run 30 minutes and/or do yoga class without increased pain    Time 6    Period Weeks    Status On-going    Target Date 12/19/19      PT LONG TERM GOAL #2   Title Pt will be able to lift and carry 20lb with improved core activation for at least 60% reduced pain    Time 6    Period Weeks    Status On-going    Target Date 12/19/19      PT LONG TERM GOAL #3   Title Pt will report only 20% limitations due to pelvic related pain due to improved posture and core    Baseline 50-40% limited and it is due to not being able get back to yoga and running    Time 6    Period Weeks    Status On-going    Target Date 12/19/19      PT LONG TERM GOAL #4   Title Pt will be ind with advnaced HEP    Time 6    Period Weeks    Status On-going    Target Date 12/19/19      PT LONG TERM GOAL #5   Title Pt will be able to perform single leg hop 10x without  pain or leakage bilateral for return to running    Time 6    Period Weeks    Status On-going    Target Date 12/19/19                 Plan - 11/07/19 1517    Clinical Impression Statement Pt is making good progress towards her goals.  She was able to start running intervals but with increased back soreness.  Coccyx pain is still feeling better and patient able to do some lifting of cinder blocks without pain only muscle soreness in LE. Pt responded well to today's treatment.  She will benefit from continued skilled PT in order to ensure successful return to active lifestyle and caring for her children.    Personal Factors and Comorbidities Time since onset of injury/illness/exacerbation;Comorbidity 1    Comorbidities 3 c-sections    Examination-Activity Limitations Bend;Lift;Transfers    Examination-Participation Restrictions Community Activity;Laundry;Yard Work    Stability/Clinical Decision Making Stable/Uncomplicated    Rehab Potential Excellent    PT Frequency 1x / week    PT Duration 6 weeks    PT Treatment/Interventions ADLs/Self Care Home Management;Biofeedback;Cryotherapy;Electrical Stimulation;Moist Heat;Neuromuscular re-education;Therapeutic exercise;Therapeutic activities;Patient/family education;Manual techniques;Dry needling;Passive range of motion;Taping    PT Next Visit Plan pt will return for 4 more visits if able since she just began to be able to run and is experencinga lot of soreness; trunk rotation and dynamic stretches    PT Home Exercise Plan Access Code: [email or web address]    Consulted and Agree with Plan of Care Patient           Patient will benefit from skilled therapeutic intervention in order to improve the following deficits and impairments:  Pain, Postural dysfunction, Decreased strength, Impaired flexibility, Increased muscle spasms, Decreased coordination, Decreased range of motion  Visit Diagnosis: Muscle weakness (generalized)  Abnormal  posture  Unspecified lack of coordination     Problem List Patient Active Problem List   Diagnosis Date Noted   Status post repeat low transverse cesarean section 05/07/2019   H/O cesarean section complicating pregnancy 02/06/2018  Junious Silk, PT 11/07/2019, 3:26 PM  [facility] [address] [place], [place], [address] Phone: [phone]   Fax:  [phone]  Name: Tracy Evans MRN: [id number] Date of Birth: 10/11/1985

## 2019-11-14 ENCOUNTER — Encounter: Payer: BC Managed Care – PPO | Admitting: Physical Therapy

## 2019-11-21 ENCOUNTER — Ambulatory Visit: Payer: BC Managed Care – PPO | Admitting: Physical Therapy

## 2019-12-05 DIAGNOSIS — M546 Pain in thoracic spine: Secondary | ICD-10-CM | POA: Diagnosis not present

## 2019-12-05 DIAGNOSIS — M9901 Segmental and somatic dysfunction of cervical region: Secondary | ICD-10-CM | POA: Diagnosis not present

## 2019-12-05 DIAGNOSIS — M9902 Segmental and somatic dysfunction of thoracic region: Secondary | ICD-10-CM | POA: Diagnosis not present

## 2019-12-05 DIAGNOSIS — M9903 Segmental and somatic dysfunction of lumbar region: Secondary | ICD-10-CM | POA: Diagnosis not present

## 2019-12-08 DIAGNOSIS — Z20822 Contact with and (suspected) exposure to covid-19: Secondary | ICD-10-CM | POA: Diagnosis not present

## 2019-12-14 ENCOUNTER — Encounter: Payer: BC Managed Care – PPO | Admitting: Physical Therapy

## 2019-12-17 DIAGNOSIS — J329 Chronic sinusitis, unspecified: Secondary | ICD-10-CM | POA: Diagnosis not present

## 2020-01-02 ENCOUNTER — Ambulatory Visit: Payer: BC Managed Care – PPO | Attending: Obstetrics and Gynecology | Admitting: Physical Therapy

## 2020-01-02 ENCOUNTER — Other Ambulatory Visit: Payer: Self-pay

## 2020-01-02 DIAGNOSIS — R279 Unspecified lack of coordination: Secondary | ICD-10-CM | POA: Diagnosis not present

## 2020-01-02 DIAGNOSIS — M9901 Segmental and somatic dysfunction of cervical region: Secondary | ICD-10-CM | POA: Diagnosis not present

## 2020-01-02 DIAGNOSIS — M9902 Segmental and somatic dysfunction of thoracic region: Secondary | ICD-10-CM | POA: Diagnosis not present

## 2020-01-02 DIAGNOSIS — R293 Abnormal posture: Secondary | ICD-10-CM | POA: Insufficient documentation

## 2020-01-02 DIAGNOSIS — M546 Pain in thoracic spine: Secondary | ICD-10-CM | POA: Diagnosis not present

## 2020-01-02 DIAGNOSIS — M6281 Muscle weakness (generalized): Secondary | ICD-10-CM | POA: Insufficient documentation

## 2020-01-02 DIAGNOSIS — M9903 Segmental and somatic dysfunction of lumbar region: Secondary | ICD-10-CM | POA: Diagnosis not present

## 2020-01-02 NOTE — Therapy (Addendum)
[facility] [address], [place], [place], [address] Phone: [phone]   Fax:  [phone]  Physical Therapy Treatment  Patient Details  Name: Tracy Evans MRN: [id number] Date of Birth: December 08, 1985 Referring Provider (PT): Janyth Contes, MD   Encounter Date: 01/02/2020   PT End of Session - 01/02/20 1445    Visit Number 11    Date for PT Re-Evaluation 12/19/19    PT Start Time 0254    PT Stop Time 1440    PT Time Calculation (min) 38 min    Activity Tolerance Patient tolerated treatment well    Behavior During Therapy [facility] for tasks assessed/performed           Past Medical History:  Diagnosis Date  . GERD (gastroesophageal reflux disease)   . H/O cesarean section complicating pregnancy 03/10/621  . Headache   . Status post repeat low transverse cesarean section 05/07/2019    Past Surgical History:  Procedure Laterality Date  . APPENDECTOMY    . CESAREAN SECTION    . CESAREAN SECTION N/A 02/09/2018   Procedure: REPEAT CESAREAN SECTION;  Surgeon: Janyth Contes, MD;  Location: [facility];  Service: Obstetrics;  Laterality: N/A;  [name],  RNFA  . CESAREAN SECTION N/A 05/07/2019   Procedure: CESAREAN SECTION;  Surgeon: Janyth Contes, MD;  Location: [facility];  Service: Obstetrics;  Laterality: N/A;  [name],  RNFA    There were no vitals filed for this visit.   Subjective Assessment - 01/02/20 1447    Subjective I am feeling good, just building up time with running.    Currently in Pain? No/denies              [facility] PT Assessment - 01/02/20 0001      Special Tests   Other special tests ASLR 0-1/10                         [facility] Adult PT Treatment/Exercise - 01/02/20 0001      Manual Therapy   Manual therapy comments thoracic and lumbar para; abdominal fascial release - mesentary, stomach and liver motility for ribcage movement             review final HEP and goals         PT Short Term Goals - 10/12/19 1103      PT SHORT TERM GOAL #3   Title Pt will be able to walk for >2 miles due to improved core strength    Baseline 1-2 miles    Status Achieved             PT Long Term Goals - 01/02/20 1406      PT LONG TERM GOAL #1   Title Be able to run 30 minutes and/or do yoga class without increased pain    Baseline able to do without pain    Status Achieved      PT LONG TERM GOAL #2   Title Pt will be able to lift and carry 20lb with improved core activation for at least 60% reduced pain    Baseline no pain    Status Achieved      PT LONG TERM GOAL #3   Title Pt will report only 20% limitations due to pelvic related pain due to improved posture and core    Baseline no limitation currently    Status Achieved      PT LONG TERM GOAL #4  Title Pt will be ind with advnaced HEP    Status Achieved      PT LONG TERM GOAL #5   Title Pt will be able to perform single leg hop 10x without pain or leakage bilateral for return to running    Baseline able to hop and running x 7 min no leakage    Status Achieved                 Plan - 01/02/20 1443    Clinical Impression Statement Pt responded well to MFR and STM during today's session with improved thoracic posture and more neutral position.  Pt overall has met all goals.  Today's session needed for final review and ensure she is ind with final HEP.  [place] today.    PT Treatment/Interventions ADLs/Self Care Home Management;Biofeedback;Cryotherapy;Electrical Stimulation;Moist Heat;Neuromuscular re-education;Therapeutic exercise;Therapeutic activities;Patient/family education;Manual techniques;Dry needling;Passive range of motion;Taping    PT Next Visit Plan d/c    PT Home Exercise Plan Access Code: [email or web address]    Consulted and Agree with Plan of Care Patient           Patient will benefit from skilled therapeutic intervention in order to improve the  following deficits and impairments:  Pain, Postural dysfunction, Decreased strength, Impaired flexibility, Increased muscle spasms, Decreased coordination, Decreased range of motion  Visit Diagnosis: Muscle weakness (generalized)  Abnormal posture  Unspecified lack of coordination     Problem List Patient Active Problem List   Diagnosis Date Noted  . Status post repeat low transverse cesarean section 05/07/2019  . H/O cesarean section complicating pregnancy 13/24/4010    Jule Ser, PT 01/02/2020, 2:47 PM  [facility] [address], [place], [place], [address] Phone: [phone]   Fax:  [phone]  Name: Tracy Evans MRN: [id number] Date of Birth: 04/03/1985  PHYSICAL THERAPY DISCHARGE SUMMARY  Visits from Start of Care: 11  Current functional level related to goals / functional outcomes: See above goals   Remaining deficits: See above   Education / Equipment: HEP  Plan: Patient agrees to discharge.  Patient goals were met. Patient is being discharged due to meeting the stated rehab goals.  ?????     [employer], PT 01/05/20 9:55 AM

## 2020-01-03 DIAGNOSIS — R439 Unspecified disturbances of smell and taste: Secondary | ICD-10-CM | POA: Diagnosis not present

## 2020-01-03 DIAGNOSIS — R432 Parageusia: Secondary | ICD-10-CM | POA: Diagnosis not present

## 2020-01-04 DIAGNOSIS — R432 Parageusia: Secondary | ICD-10-CM | POA: Diagnosis not present

## 2020-01-04 DIAGNOSIS — R439 Unspecified disturbances of smell and taste: Secondary | ICD-10-CM | POA: Diagnosis not present

## 2020-01-09 ENCOUNTER — Encounter: Payer: BC Managed Care – PPO | Admitting: Physical Therapy

## 2020-01-16 ENCOUNTER — Encounter: Payer: BC Managed Care – PPO | Admitting: Physical Therapy

## 2020-01-22 DIAGNOSIS — Z20822 Contact with and (suspected) exposure to covid-19: Secondary | ICD-10-CM | POA: Diagnosis not present

## 2020-01-23 ENCOUNTER — Encounter: Payer: BC Managed Care – PPO | Admitting: Physical Therapy

## 2020-01-30 ENCOUNTER — Encounter: Payer: BC Managed Care – PPO | Admitting: Physical Therapy

## 2020-01-31 DIAGNOSIS — M546 Pain in thoracic spine: Secondary | ICD-10-CM | POA: Diagnosis not present

## 2020-01-31 DIAGNOSIS — M9903 Segmental and somatic dysfunction of lumbar region: Secondary | ICD-10-CM | POA: Diagnosis not present

## 2020-01-31 DIAGNOSIS — M9901 Segmental and somatic dysfunction of cervical region: Secondary | ICD-10-CM | POA: Diagnosis not present

## 2020-01-31 DIAGNOSIS — M9902 Segmental and somatic dysfunction of thoracic region: Secondary | ICD-10-CM | POA: Diagnosis not present

## 2020-03-06 DIAGNOSIS — Z20822 Contact with and (suspected) exposure to covid-19: Secondary | ICD-10-CM | POA: Diagnosis not present

## 2020-03-10 DIAGNOSIS — Z20822 Contact with and (suspected) exposure to covid-19: Secondary | ICD-10-CM | POA: Diagnosis not present

## 2020-04-12 DIAGNOSIS — E162 Hypoglycemia, unspecified: Secondary | ICD-10-CM | POA: Diagnosis not present

## 2020-04-12 DIAGNOSIS — R1110 Vomiting, unspecified: Secondary | ICD-10-CM | POA: Diagnosis not present

## 2020-04-12 DIAGNOSIS — R197 Diarrhea, unspecified: Secondary | ICD-10-CM | POA: Diagnosis not present

## 2020-04-13 DIAGNOSIS — R1110 Vomiting, unspecified: Secondary | ICD-10-CM | POA: Diagnosis not present

## 2020-04-13 DIAGNOSIS — R197 Diarrhea, unspecified: Secondary | ICD-10-CM | POA: Diagnosis not present

## 2020-04-13 DIAGNOSIS — K529 Noninfective gastroenteritis and colitis, unspecified: Secondary | ICD-10-CM | POA: Diagnosis not present

## 2020-04-13 DIAGNOSIS — E162 Hypoglycemia, unspecified: Secondary | ICD-10-CM | POA: Diagnosis not present

## 2020-05-23 DIAGNOSIS — Z13220 Encounter for screening for lipoid disorders: Secondary | ICD-10-CM | POA: Diagnosis not present

## 2020-05-23 DIAGNOSIS — G43909 Migraine, unspecified, not intractable, without status migrainosus: Secondary | ICD-10-CM | POA: Diagnosis not present

## 2020-05-23 DIAGNOSIS — Z0000 Encounter for general adult medical examination without abnormal findings: Secondary | ICD-10-CM | POA: Diagnosis not present

## 2020-05-23 DIAGNOSIS — E559 Vitamin D deficiency, unspecified: Secondary | ICD-10-CM | POA: Diagnosis not present

## 2020-07-04 DIAGNOSIS — M545 Low back pain, unspecified: Secondary | ICD-10-CM | POA: Diagnosis not present

## 2020-07-07 DIAGNOSIS — M545 Low back pain, unspecified: Secondary | ICD-10-CM | POA: Diagnosis not present

## 2020-07-11 DIAGNOSIS — M545 Low back pain, unspecified: Secondary | ICD-10-CM | POA: Diagnosis not present

## 2020-07-18 DIAGNOSIS — M545 Low back pain, unspecified: Secondary | ICD-10-CM | POA: Diagnosis not present

## 2020-07-30 DIAGNOSIS — M545 Low back pain, unspecified: Secondary | ICD-10-CM | POA: Diagnosis not present

## 2020-08-01 IMAGING — US US RENAL
1 series · 15 of 25 positions shown · non-contrast
Comparison: None

CLINICAL DATA: Lower back pain, kidney stone, 22 weeks pregnant

EXAM:
RENAL / URINARY TRACT ULTRASOUND COMPLETE

[Series 1: us renal · 41 acquisitions, 15 frames shown]
[im 1/41]
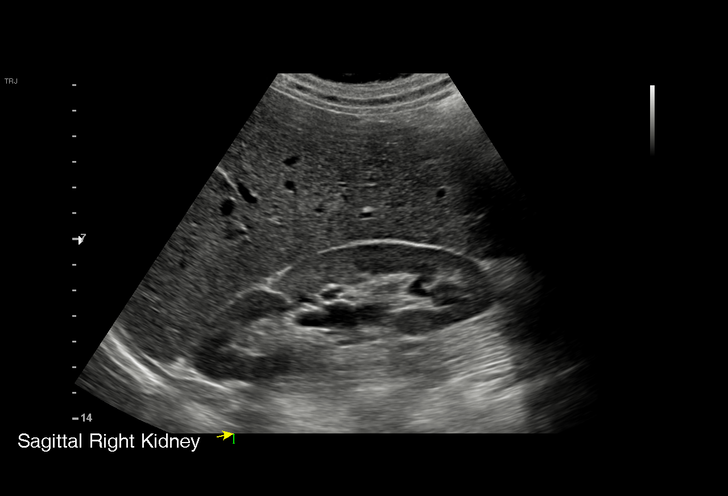
[im 4/41]
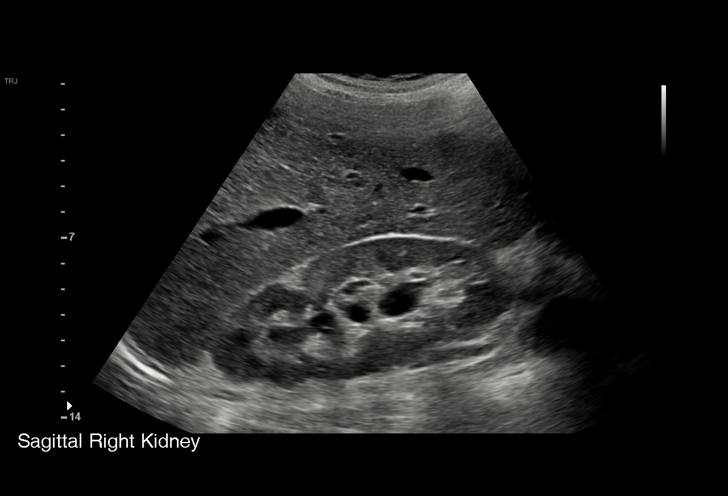
[im 7/41]
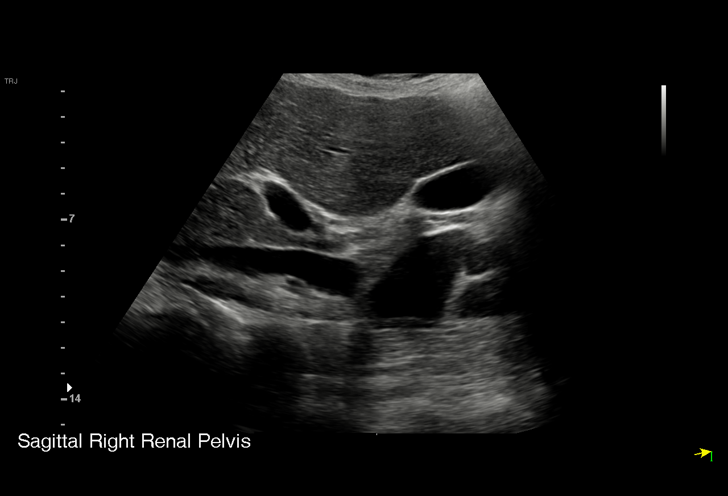
[im 9/41]
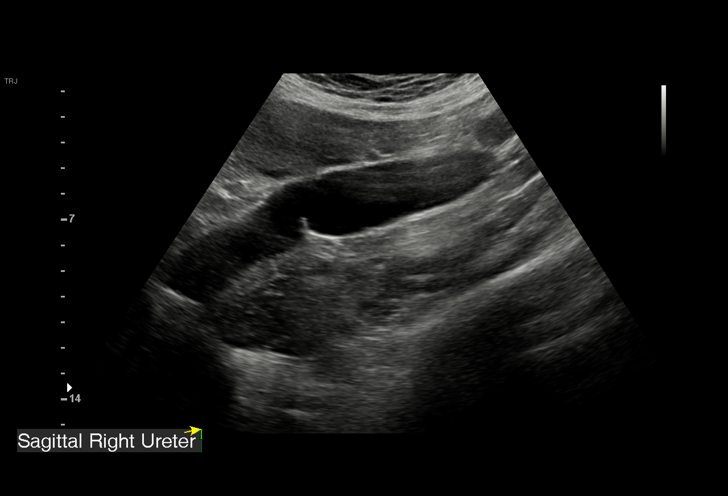
[im 12/41]
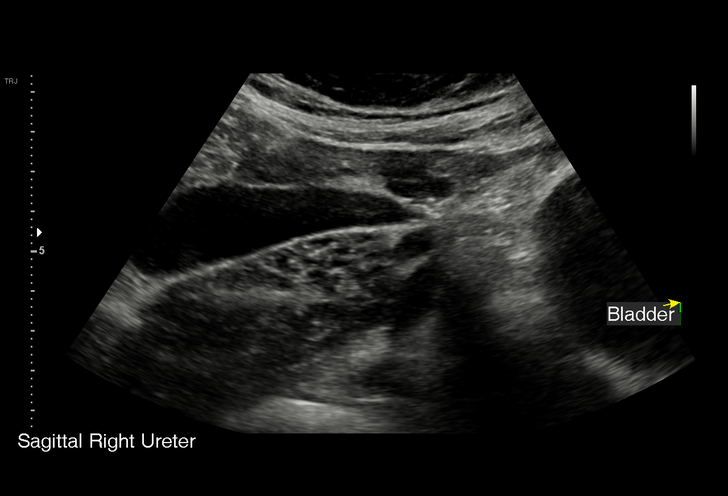
[im 16/41]
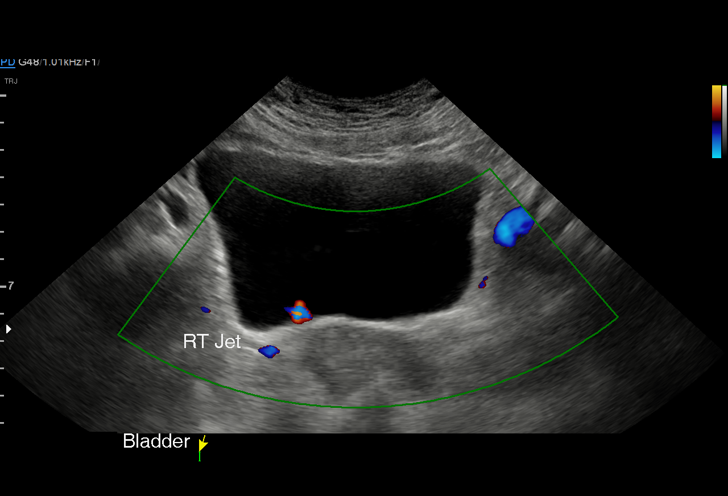
[im 17/41]
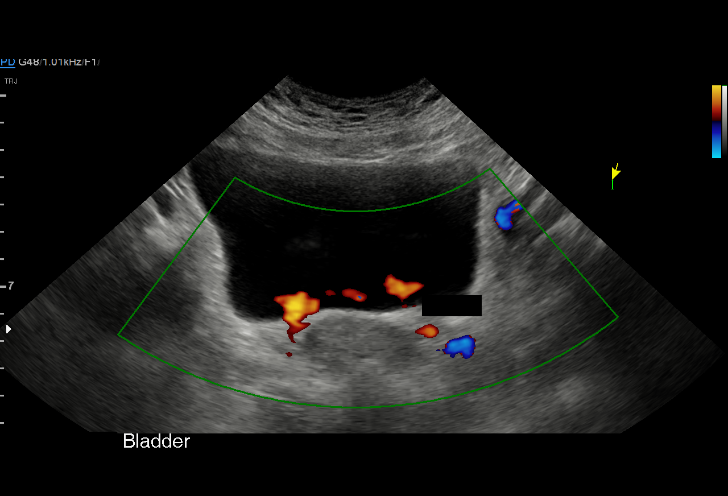
[im 21/41]
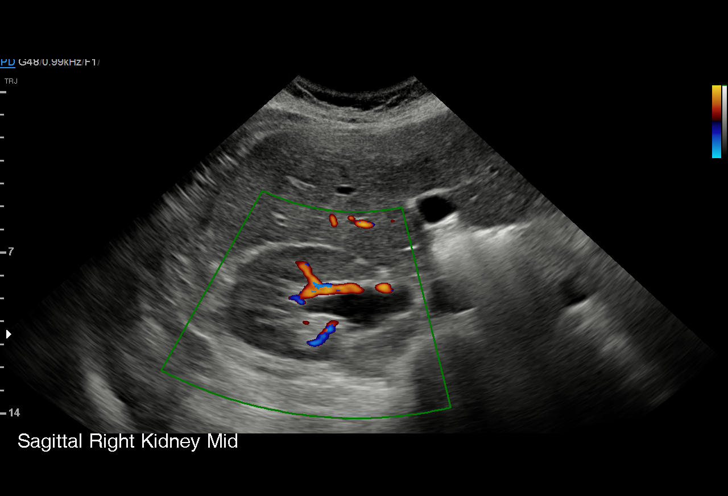
[im 24/41]
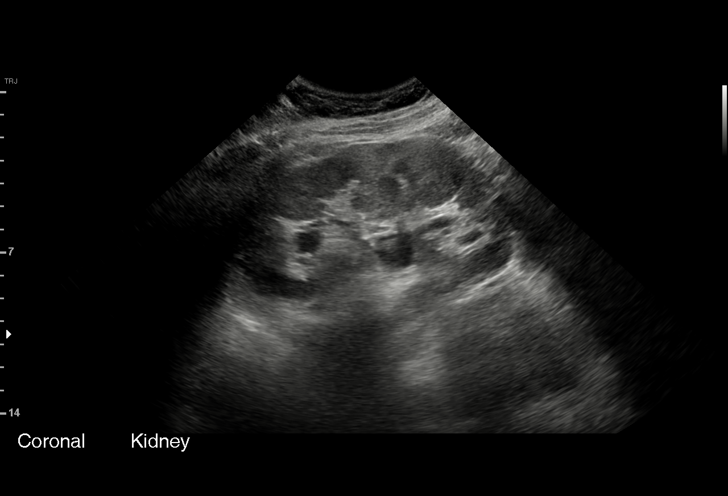
[im 26/41]
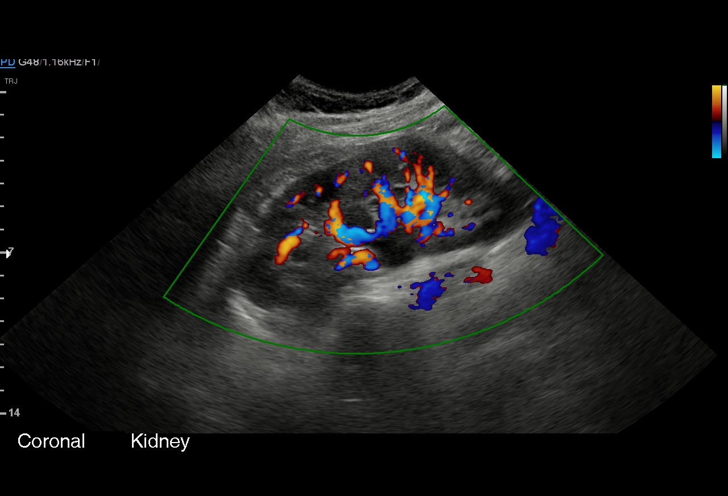
[im 29/41]
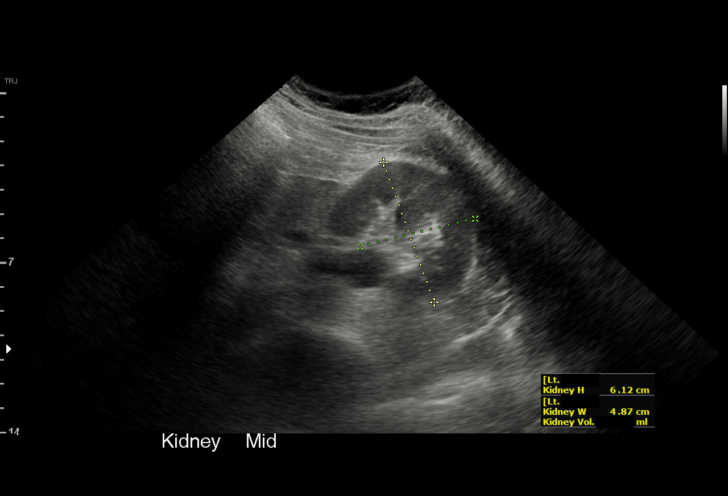
[im 32/41]
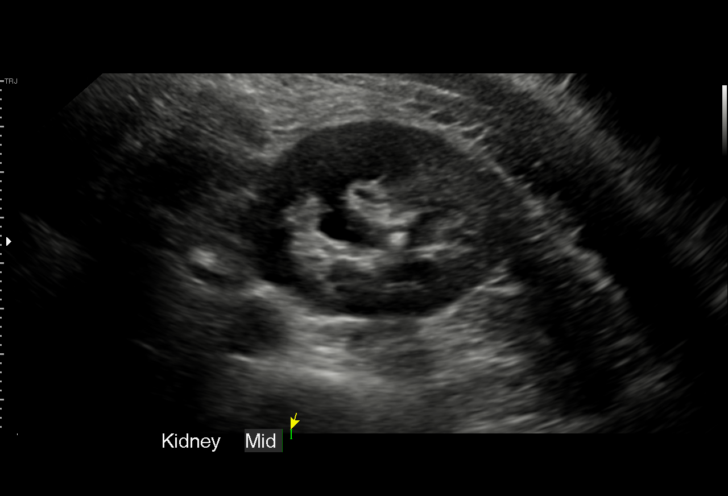
[im 34/41]
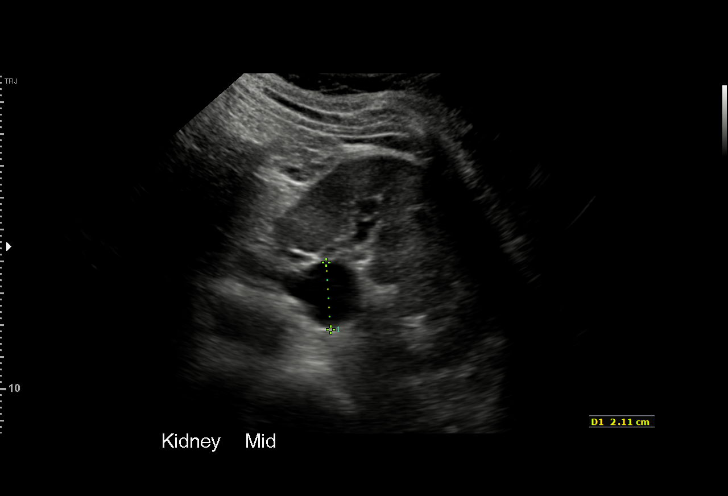
[im 37/41]
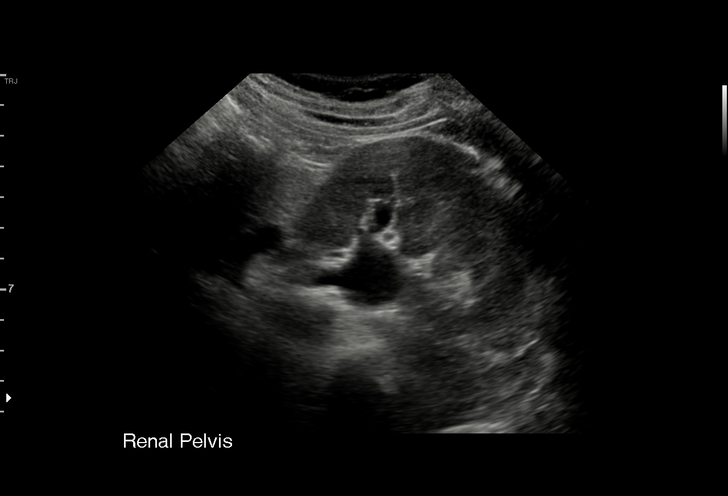
[im 41/41]
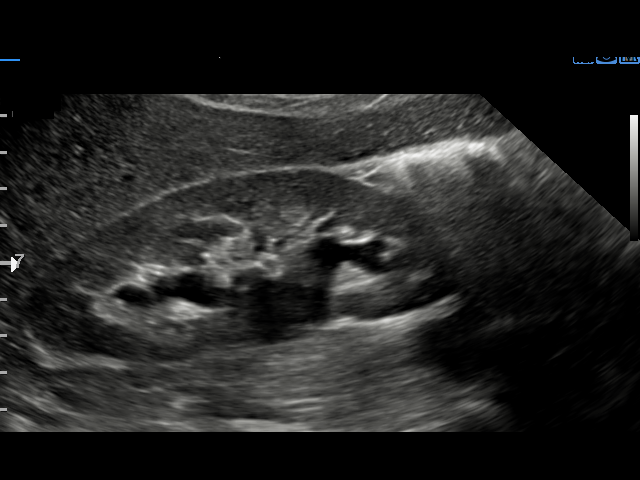

[15 of 25 positions shown; findings below may reference images not displayed]

FINDINGS: Right Kidney:

Renal measurements: 12.5 x 4.9 x 5.6 cm = volume: 178 mL. Normal
cortical thickness and echogenicity. Mild to moderate RIGHT
hydronephrosis. Dilated RIGHT ureter up to 2.5 cm diameter. No renal
mass or definite calcification. Tapering of distal RIGHT ureter is
seen within the pelvis. No definite ureteral calculus visualized.

Left Kidney:

Renal measurements: 12.4 x 6.1 x 4.9 cm = volume: 194 mL. Normal
cortical thickness and echogenicity. Mild LEFT hydronephrosis. Mild
dilatation of LEFT ureter. Questionable 5 mm nonobstructing calculus
within mid LEFT kidney. No definite ureteral calcifications seen.

Bladder:

Appears normal for degree of bladder distention. BILATERAL ureteral
jets visualized. No definite shadowing calculi are seen at the
ureterovesical junctions.

Other:

N/A
IMPRESSION: BILATERAL hydronephrosis and hydroureter RIGHT greater than LEFT.

However ureteral jets are present bilaterally and no definite
ureteral calculi are seen.

Question 5 mm nonobstructing mid LEFT renal calculus.

## 2020-08-06 DIAGNOSIS — R1031 Right lower quadrant pain: Secondary | ICD-10-CM | POA: Diagnosis not present

## 2020-08-06 DIAGNOSIS — R1011 Right upper quadrant pain: Secondary | ICD-10-CM | POA: Diagnosis not present

## 2020-08-07 DIAGNOSIS — M545 Low back pain, unspecified: Secondary | ICD-10-CM | POA: Diagnosis not present

## 2020-08-15 DIAGNOSIS — M545 Low back pain, unspecified: Secondary | ICD-10-CM | POA: Diagnosis not present

## 2020-09-02 DIAGNOSIS — R0989 Other specified symptoms and signs involving the circulatory and respiratory systems: Secondary | ICD-10-CM | POA: Diagnosis not present

## 2020-09-02 DIAGNOSIS — Z20828 Contact with and (suspected) exposure to other viral communicable diseases: Secondary | ICD-10-CM | POA: Diagnosis not present

## 2020-09-02 DIAGNOSIS — J029 Acute pharyngitis, unspecified: Secondary | ICD-10-CM | POA: Diagnosis not present

## 2020-09-02 DIAGNOSIS — Z20822 Contact with and (suspected) exposure to covid-19: Secondary | ICD-10-CM | POA: Diagnosis not present

## 2020-09-10 DIAGNOSIS — M545 Low back pain, unspecified: Secondary | ICD-10-CM | POA: Diagnosis not present

## 2020-09-24 DIAGNOSIS — M545 Low back pain, unspecified: Secondary | ICD-10-CM | POA: Diagnosis not present

## 2020-10-08 DIAGNOSIS — M545 Low back pain, unspecified: Secondary | ICD-10-CM | POA: Diagnosis not present

## 2020-10-10 DIAGNOSIS — R102 Pelvic and perineal pain: Secondary | ICD-10-CM | POA: Diagnosis not present

## 2020-10-13 DIAGNOSIS — D225 Melanocytic nevi of trunk: Secondary | ICD-10-CM | POA: Diagnosis not present

## 2020-10-13 DIAGNOSIS — L814 Other melanin hyperpigmentation: Secondary | ICD-10-CM | POA: Diagnosis not present

## 2020-10-13 DIAGNOSIS — D2271 Melanocytic nevi of right lower limb, including hip: Secondary | ICD-10-CM | POA: Diagnosis not present

## 2020-10-13 DIAGNOSIS — L578 Other skin changes due to chronic exposure to nonionizing radiation: Secondary | ICD-10-CM | POA: Diagnosis not present

## 2020-10-23 DIAGNOSIS — M545 Low back pain, unspecified: Secondary | ICD-10-CM | POA: Diagnosis not present

## 2020-10-30 DIAGNOSIS — R102 Pelvic and perineal pain: Secondary | ICD-10-CM | POA: Diagnosis not present

## 2020-11-03 DIAGNOSIS — M545 Low back pain, unspecified: Secondary | ICD-10-CM | POA: Diagnosis not present

## 2020-11-24 DIAGNOSIS — N911 Secondary amenorrhea: Secondary | ICD-10-CM | POA: Diagnosis not present

## 2020-11-24 DIAGNOSIS — O219 Vomiting of pregnancy, unspecified: Secondary | ICD-10-CM | POA: Diagnosis not present

## 2020-11-24 DIAGNOSIS — Z3201 Encounter for pregnancy test, result positive: Secondary | ICD-10-CM | POA: Diagnosis not present

## 2020-11-26 DIAGNOSIS — M545 Low back pain, unspecified: Secondary | ICD-10-CM | POA: Diagnosis not present

## 2020-12-11 DIAGNOSIS — O09519 Supervision of elderly primigravida, unspecified trimester: Secondary | ICD-10-CM | POA: Diagnosis not present

## 2020-12-11 DIAGNOSIS — Z124 Encounter for screening for malignant neoplasm of cervix: Secondary | ICD-10-CM | POA: Diagnosis not present

## 2020-12-11 DIAGNOSIS — Z3A09 9 weeks gestation of pregnancy: Secondary | ICD-10-CM | POA: Diagnosis not present

## 2020-12-11 DIAGNOSIS — Z3689 Encounter for other specified antenatal screening: Secondary | ICD-10-CM | POA: Diagnosis not present

## 2020-12-11 DIAGNOSIS — Z113 Encounter for screening for infections with a predominantly sexual mode of transmission: Secondary | ICD-10-CM | POA: Diagnosis not present

## 2020-12-11 DIAGNOSIS — O26891 Other specified pregnancy related conditions, first trimester: Secondary | ICD-10-CM | POA: Diagnosis not present

## 2020-12-11 LAB — OB RESULTS CONSOLE ABO/RH: RH Type: POSITIVE

## 2020-12-11 LAB — OB RESULTS CONSOLE GC/CHLAMYDIA
Chlamydia: NEGATIVE
Neisseria Gonorrhea: NEGATIVE

## 2020-12-11 LAB — HEPATITIS C ANTIBODY: HCV Ab: NEGATIVE

## 2020-12-11 LAB — OB RESULTS CONSOLE RUBELLA ANTIBODY, IGM: Rubella: IMMUNE

## 2020-12-11 LAB — OB RESULTS CONSOLE HEPATITIS B SURFACE ANTIGEN: Hepatitis B Surface Ag: NEGATIVE

## 2020-12-11 LAB — OB RESULTS CONSOLE ANTIBODY SCREEN: Antibody Screen: NEGATIVE

## 2020-12-11 LAB — OB RESULTS CONSOLE HIV ANTIBODY (ROUTINE TESTING): HIV: NONREACTIVE

## 2020-12-11 LAB — OB RESULTS CONSOLE RPR: RPR: NONREACTIVE

## 2020-12-29 DIAGNOSIS — M545 Low back pain, unspecified: Secondary | ICD-10-CM | POA: Diagnosis not present

## 2021-01-01 DIAGNOSIS — Z23 Encounter for immunization: Secondary | ICD-10-CM | POA: Diagnosis not present

## 2021-01-12 DIAGNOSIS — M545 Low back pain, unspecified: Secondary | ICD-10-CM | POA: Diagnosis not present

## 2021-03-06 DIAGNOSIS — M545 Low back pain, unspecified: Secondary | ICD-10-CM | POA: Diagnosis not present

## 2021-03-16 DIAGNOSIS — O09519 Supervision of elderly primigravida, unspecified trimester: Secondary | ICD-10-CM | POA: Diagnosis not present

## 2021-03-16 DIAGNOSIS — Z3A22 22 weeks gestation of pregnancy: Secondary | ICD-10-CM | POA: Diagnosis not present

## 2021-03-16 DIAGNOSIS — Z362 Encounter for other antenatal screening follow-up: Secondary | ICD-10-CM | POA: Diagnosis not present

## 2021-04-28 DIAGNOSIS — Z3689 Encounter for other specified antenatal screening: Secondary | ICD-10-CM | POA: Diagnosis not present

## 2021-05-01 ENCOUNTER — Observation Stay (HOSPITAL_COMMUNITY)
Admission: AD | Admit: 2021-05-01 | Discharge: 2021-05-02 | Disposition: A | Discharge status: Home / Self Care | Payer: BC Managed Care – PPO | Attending: Obstetrics and Gynecology | Admitting: Obstetrics and Gynecology

## 2021-05-01 ENCOUNTER — Inpatient Hospital Stay (HOSPITAL_COMMUNITY): Payer: BC Managed Care – PPO

## 2021-05-01 ENCOUNTER — Encounter (HOSPITAL_COMMUNITY): Payer: Self-pay | Admitting: Obstetrics and Gynecology

## 2021-05-01 ENCOUNTER — Other Ambulatory Visit: Payer: Self-pay

## 2021-05-01 DIAGNOSIS — R109 Unspecified abdominal pain: Secondary | ICD-10-CM | POA: Diagnosis not present

## 2021-05-01 DIAGNOSIS — Z20822 Contact with and (suspected) exposure to covid-19: Secondary | ICD-10-CM | POA: Insufficient documentation

## 2021-05-01 DIAGNOSIS — O26899 Other specified pregnancy related conditions, unspecified trimester: Secondary | ICD-10-CM | POA: Diagnosis not present

## 2021-05-01 DIAGNOSIS — R161 Splenomegaly, not elsewhere classified: Secondary | ICD-10-CM | POA: Diagnosis not present

## 2021-05-01 DIAGNOSIS — Z3A29 29 weeks gestation of pregnancy: Secondary | ICD-10-CM | POA: Insufficient documentation

## 2021-05-01 DIAGNOSIS — O26893 Other specified pregnancy related conditions, third trimester: Secondary | ICD-10-CM | POA: Diagnosis not present

## 2021-05-01 DIAGNOSIS — R1011 Right upper quadrant pain: Secondary | ICD-10-CM | POA: Diagnosis not present

## 2021-05-01 LAB — CBC WITH DIFFERENTIAL/PLATELET
Abs Immature Granulocytes: 0.04 10*3/uL (ref 0.00–0.07)
Basophils Absolute: 0 10*3/uL (ref 0.0–0.1)
Basophils Relative: 0 %
Eosinophils Absolute: 0 10*3/uL (ref 0.0–0.5)
Eosinophils Relative: 0 %
HCT: 35 % — ABNORMAL LOW (ref 36.0–46.0)
Hemoglobin: 12.4 g/dL (ref 12.0–15.0)
Immature Granulocytes: 0 %
Lymphocytes Relative: 10 %
Lymphs Abs: 0.9 10*3/uL (ref 0.7–4.0)
MCH: 31.6 pg (ref 26.0–34.0)
MCHC: 35.4 g/dL (ref 30.0–36.0)
MCV: 89.1 fL (ref 80.0–100.0)
Monocytes Absolute: 0.5 10*3/uL (ref 0.1–1.0)
Monocytes Relative: 6 %
Neutro Abs: 7.4 10*3/uL (ref 1.7–7.7)
Neutrophils Relative %: 84 %
Platelets: 196 10*3/uL (ref 150–400)
RBC: 3.93 MIL/uL (ref 3.87–5.11)
RDW: 12 % (ref 11.5–15.5)
WBC: 8.9 10*3/uL (ref 4.0–10.5)
nRBC: 0 % (ref 0.0–0.2)

## 2021-05-01 LAB — COMPREHENSIVE METABOLIC PANEL
ALT: 19 U/L (ref 0–44)
AST: 20 U/L (ref 15–41)
Albumin: 2.9 g/dL — ABNORMAL LOW (ref 3.5–5.0)
Alkaline Phosphatase: 74 U/L (ref 38–126)
Anion gap: 10 (ref 5–15)
BUN: <5 mg/dL — ABNORMAL LOW (ref 6–20)
CO2: 19 mmol/L — ABNORMAL LOW (ref 22–32)
Calcium: 8.2 mg/dL — ABNORMAL LOW (ref 8.9–10.3)
Chloride: 105 mmol/L (ref 98–111)
Creatinine, Ser: 0.46 mg/dL (ref 0.44–1.00)
GFR, Estimated: >60 mL/min (ref >60)
Glucose, Bld: 90 mg/dL (ref 70–99)
Potassium: 3.6 mmol/L (ref 3.5–5.1)
Sodium: 134 mmol/L — ABNORMAL LOW (ref 135–145)
Total Bilirubin: 0.4 mg/dL (ref 0.3–1.2)
Total Protein: 6 g/dL — ABNORMAL LOW (ref 6.5–8.1)

## 2021-05-01 LAB — URINALYSIS, ROUTINE W REFLEX MICROSCOPIC
Bilirubin Urine: NEGATIVE
Glucose, UA: NEGATIVE mg/dL
Hgb urine dipstick: NEGATIVE
Ketones, ur: NEGATIVE mg/dL
Leukocytes,Ua: NEGATIVE
Nitrite: NEGATIVE
Protein, ur: NEGATIVE mg/dL
Specific Gravity, Urine: 1.004 — ABNORMAL LOW (ref 1.005–1.030)
pH: 6 (ref 5.0–8.0)

## 2021-05-01 LAB — AMYLASE: Amylase: 76 U/L (ref 28–100)

## 2021-05-01 LAB — LIPASE, BLOOD: Lipase: 53 U/L — ABNORMAL HIGH (ref 11–51)

## 2021-05-01 MED ORDER — DOCUSATE SODIUM 100 MG PO CAPS
100.0000 mg | ORAL_CAPSULE | Freq: Every day | ORAL | Status: DC
  Administered 2021-05-02: 100 mg via ORAL
  Filled 2021-05-01: qty 1

## 2021-05-01 MED ORDER — HYDROMORPHONE HCL 1 MG/ML IJ SOLN
1.0000 mg | Freq: Once | INTRAMUSCULAR | Status: AC
  Administered 2021-05-01: 1 mg via INTRAVENOUS
  Filled 2021-05-01: qty 1

## 2021-05-01 MED ORDER — ONDANSETRON HCL 4 MG/2ML IJ SOLN: 4.0000 mg | Freq: Three times a day (TID) | INTRAMUSCULAR | Status: DC | PRN

## 2021-05-01 MED ORDER — OXYCODONE HCL 5 MG PO TABS
5.0000 mg | ORAL_TABLET | ORAL | Status: DC | PRN
  Administered 2021-05-02 (×2): 5 mg via ORAL
  Filled 2021-05-01 (×2): qty 1

## 2021-05-01 MED ORDER — HYDROMORPHONE HCL 1 MG/ML IJ SOLN
INTRAMUSCULAR | Status: AC
  Filled 2021-05-01: qty 1

## 2021-05-01 MED ORDER — ACETAMINOPHEN 325 MG PO TABS
650.0000 mg | ORAL_TABLET | ORAL | Status: DC | PRN
  Administered 2021-05-01 – 2021-05-02 (×2): 650 mg via ORAL
  Filled 2021-05-01 (×2): qty 2

## 2021-05-01 MED ORDER — CYCLOBENZAPRINE HCL 5 MG PO TABS
10.0000 mg | ORAL_TABLET | Freq: Once | ORAL | Status: AC
  Administered 2021-05-01: 10 mg via ORAL
  Filled 2021-05-01: qty 2

## 2021-05-01 MED ORDER — CYCLOBENZAPRINE HCL 10 MG PO TABS
10.0000 mg | ORAL_TABLET | Freq: Three times a day (TID) | ORAL | Status: DC
  Administered 2021-05-02 (×2): 10 mg via ORAL
  Filled 2021-05-01 (×2): qty 1

## 2021-05-01 MED ORDER — ONDANSETRON HCL 4 MG/2ML IJ SOLN
4.0000 mg | Freq: Once | INTRAMUSCULAR | Status: AC
  Administered 2021-05-01: 4 mg via INTRAVENOUS
  Filled 2021-05-01: qty 2

## 2021-05-01 MED ORDER — LACTATED RINGERS IV SOLN: INTRAVENOUS | Status: DC

## 2021-05-01 MED ORDER — TAMSULOSIN HCL 0.4 MG PO CAPS
0.4000 mg | ORAL_CAPSULE | Freq: Every day | ORAL | Status: DC
  Administered 2021-05-02: 0.4 mg via ORAL
  Filled 2021-05-01 (×2): qty 1

## 2021-05-01 MED ORDER — CALCIUM CARBONATE ANTACID 500 MG PO CHEW: 2.0000 | CHEWABLE_TABLET | ORAL | Status: DC | PRN

## 2021-05-01 MED ORDER — PRENATAL MULTIVITAMIN CH
1.0000 | ORAL_TABLET | Freq: Every day | ORAL | Status: DC
  Filled 2021-05-01 (×2): qty 1

## 2021-05-01 MED ORDER — ZOLPIDEM TARTRATE 5 MG PO TABS: 5.0000 mg | ORAL_TABLET | Freq: Every evening | ORAL | Status: DC | PRN

## 2021-05-01 MED ORDER — HYDROMORPHONE HCL 1 MG/ML IJ SOLN
1.0000 mg | INTRAMUSCULAR | Status: DC | PRN
  Administered 2021-05-02: 1 mg via INTRAVENOUS
  Filled 2021-05-01 (×2): qty 1

## 2021-05-01 MED ORDER — HYDROMORPHONE HCL 1 MG/ML IJ SOLN
1.0000 mg | Freq: Once | INTRAMUSCULAR | Status: AC
  Administered 2021-05-01: 1 mg via INTRAVENOUS

## 2021-05-01 MED ORDER — LACTATED RINGERS IV BOLUS: 1000.0000 mL | Freq: Once | INTRAVENOUS | Status: DC

## 2021-05-01 NOTE — Plan of Care (Signed)

## 2021-05-01 NOTE — MAU Provider Note (Addendum)
?History  ?  ? ?CSN: [id number] ? ?Arrival date and time: 05/01/21 1411 ? ? Event Date/Time  ? First Provider Initiated Contact with Patient 05/01/21 1501   ?  ? ?Chief Complaint  ?Patient presents with  ? Abdominal Pain  ? ?Tracy Evans is a 36 y.o. [phone] at [redacted]w[redacted]d who presents today with RUQ pain. She denies any contractions, VB or LOF. She reports normal fetal movement.  ? ?Abdominal Pain ?This is a new problem. The current episode started yesterday. The onset quality is gradual. The problem occurs constantly. The problem has been unchanged. The pain is located in the RUQ. The pain is at a severity of 9/10. The quality of the pain is sharp. The abdominal pain does not radiate. Pertinent negatives include no fever, nausea or vomiting. Nothing aggravates the pain. The pain is relieved by Nothing. She has tried nothing for the symptoms.  ? ?OB History   ? ? Gravida  ?4  ? Para  ?3  ? Term  ?3  ? Preterm  ?   ? AB  ?   ? Living  ?3  ?  ? ? SAB  ?   ? IAB  ?   ? Ectopic  ?   ? Multiple  ?0  ? Live Births  ?3  ?   ?  ?  ? ? ?Past Medical History:  ?Diagnosis Date  ? GERD (gastroesophageal reflux disease)   ? H/O cesarean section complicating pregnancy 123XX123  ? Headache   ? Status post repeat low transverse cesarean section 05/07/2019  ? ? ?Past Surgical History:  ?Procedure Laterality Date  ? APPENDECTOMY    ? CESAREAN SECTION    ? CESAREAN SECTION N/A 02/09/2018  ? Procedure: REPEAT CESAREAN SECTION;  Surgeon: Janyth Contes, MD;  Location: [place];  Service: Obstetrics;  Laterality: N/ANira Conn,  RNFA  ? CESAREAN SECTION N/A 05/07/2019  ? Procedure: CESAREAN SECTION;  Surgeon: Janyth Contes, MD;  Location: [facility];  Service: Obstetrics;  Laterality: N/ANira Conn,  RNFA  ? ? ?Family History  ?Problem Relation Age of Onset  ? Cancer Maternal Grandmother   ?     br  ? Cancer Paternal Grandmother   ?     colon  ? Healthy Mother   ? Healthy Father   ? ? ?Social History  ? ?Tobacco Use  ?  Smoking status: Never  ? Smokeless tobacco: Never  ?Vaping Use  ? Vaping Use: Never used  ?Substance Use Topics  ? Alcohol use: Not Currently  ? Drug use: Never  ? ? ?Allergies: No Known Allergies ? ?Medications Prior to Admission  ?Medication Sig Dispense Refill Last Dose  ? acetaminophen (TYLENOL) 500 MG tablet Take 1,000 mg by mouth every 8 (eight) hours as needed (pain).   05/01/2021  ? docusate sodium (COLACE) 100 MG capsule Take 1 capsule (100 mg total) by mouth 2 (two) times daily. 30 capsule 0 Past Week  ? pantoprazole (PROTONIX) 40 MG tablet Take 40 mg by mouth daily.   04/30/2021  ? Prenatal Vit-Fe Fumarate-FA (PRENATAL MULTIVITAMIN) TABS tablet Take 1 tablet by mouth daily at 12 noon.   05/01/2021  ? doxylamine, Sleep, (UNISOM) 25 MG tablet Take 12.5 mg by mouth at bedtime as needed for sleep.     ? esomeprazole (NEXIUM) 40 MG capsule Take 40 mg by mouth daily at 12 noon.     ? ibuprofen (ADVIL) 800 MG tablet Take 1 tablet (800 mg  total) by mouth every 8 (eight) hours. 60 tablet 1   ? OVER THE COUNTER MEDICATION Apply 1 application topically daily as needed (For restless legs.). [employer] Magnesium lotion     ? oxyCODONE (OXY IR/ROXICODONE) 5 MG immediate release tablet Take 1-2 tablets (5-10 mg total) by mouth every 6 (six) hours as needed for severe pain. 30 tablet 0   ? ? ?Review of Systems  ?Constitutional:  Negative for fever.  ?Gastrointestinal:  Positive for abdominal pain. Negative for nausea and vomiting.  ?All other systems reviewed and are negative. ?Physical Exam  ? ?Blood pressure 133/70, pulse (!) 105, temperature 98.7 ?F (37.1 ?C), temperature source Oral, resp. rate 20, height 5\' 8"  (1.727 m), weight 97.8 kg, SpO2 98 %, unknown if currently breastfeeding. ? ?Physical Exam ?Constitutional:   ?   Appearance: She is well-developed.  ?HENT:  ?   Head: Normocephalic.  ?Eyes:  ?   Pupils: Pupils are equal, round, and reactive to light.  ?Cardiovascular:  ?   Rate and Rhythm: Normal rate and  regular rhythm.  ?   Heart sounds: Normal heart sounds.  ?Pulmonary:  ?   Effort: Pulmonary effort is normal. No respiratory distress.  ?   Breath sounds: Normal breath sounds.  ?Abdominal:  ?   Palpations: Abdomen is soft.  ?   Tenderness: There is abdominal tenderness.  ?Genitourinary: ?   Vagina: No bleeding. Vaginal discharge: mucusy. ?   Comments: External: no lesion ?Vagina: small amount of white discharge ?  ? ? ?Musculoskeletal:     ?   General: Normal range of motion.  ?   Cervical back: Normal range of motion and neck supple.  ?Skin: ?   General: Skin is warm and dry.  ?Neurological:  ?   Mental Status: She is alert and oriented to person, place, and time.  ?Psychiatric:     ?   Mood and Affect: Mood normal.     ?   Behavior: Behavior normal.  ? ?NST:  ?Baseline: 150 ?Variability: moderate ?Accels: 15x15 ?Decels: none ?Toco: none ?Reactive/Appropriate for GA ? ?Results for orders placed or performed during the hospital encounter of 05/01/21 (from the past 24 hour(s))  ?Urinalysis, Routine w reflex microscopic Urine, Clean Catch     Status: Abnormal  ? Collection Time: 05/01/21  3:06 PM  ?Result Value Ref Range  ? Color, Urine STRAW (A) YELLOW  ? APPearance CLEAR CLEAR  ? Specific Gravity, Urine 1.004 (L) 1.005 - 1.030  ? pH 6.0 5.0 - 8.0  ? Glucose, UA NEGATIVE NEGATIVE mg/dL  ? Hgb urine dipstick NEGATIVE NEGATIVE  ? Bilirubin Urine NEGATIVE NEGATIVE  ? Ketones, ur NEGATIVE NEGATIVE mg/dL  ? Protein, ur NEGATIVE NEGATIVE mg/dL  ? Nitrite NEGATIVE NEGATIVE  ? Leukocytes,Ua NEGATIVE NEGATIVE  ?CBC with Differential/Platelet     Status: Abnormal  ? Collection Time: 05/01/21  3:25 PM  ?Result Value Ref Range  ? WBC 8.9 4.0 - 10.5 K/uL  ? RBC 3.93 3.87 - 5.11 MIL/uL  ? Hemoglobin 12.4 12.0 - 15.0 g/dL  ? HCT 35.0 (L) 36.0 - 46.0 %  ? MCV 89.1 80.0 - 100.0 fL  ? MCH 31.6 26.0 - 34.0 pg  ? MCHC 35.4 30.0 - 36.0 g/dL  ? RDW 12.0 11.5 - 15.5 %  ? Platelets 196 150 - 400 K/uL  ? nRBC 0.0 0.0 - 0.2 %  ? Neutrophils  Relative % 84 %  ? Neutro Abs 7.4 1.7 - 7.7 K/uL  ?  Lymphocytes Relative 10 %  ? Lymphs Abs 0.9 0.7 - 4.0 K/uL  ? Monocytes Relative 6 %  ? Monocytes Absolute 0.5 0.1 - 1.0 K/uL  ? Eosinophils Relative 0 %  ? Eosinophils Absolute 0.0 0.0 - 0.5 K/uL  ? Basophils Relative 0 %  ? Basophils Absolute 0.0 0.0 - 0.1 K/uL  ? Immature Granulocytes 0 %  ? Abs Immature Granulocytes 0.04 0.00 - 0.07 K/uL  ?Comprehensive metabolic panel     Status: Abnormal  ? Collection Time: 05/01/21  3:25 PM  ?Result Value Ref Range  ? Sodium 134 (L) 135 - 145 mmol/L  ? Potassium 3.6 3.5 - 5.1 mmol/L  ? Chloride 105 98 - 111 mmol/L  ? CO2 19 (L) 22 - 32 mmol/L  ? Glucose, Bld 90 70 - 99 mg/dL  ? BUN <5 (L) 6 - 20 mg/dL  ? Creatinine, Ser 0.46 0.44 - 1.00 mg/dL  ? Calcium 8.2 (L) 8.9 - 10.3 mg/dL  ? Total Protein 6.0 (L) 6.5 - 8.1 g/dL  ? Albumin 2.9 (L) 3.5 - 5.0 g/dL  ? AST 20 15 - 41 U/L  ? ALT 19 0 - 44 U/L  ? Alkaline Phosphatase 74 38 - 126 U/L  ? Total Bilirubin 0.4 0.3 - 1.2 mg/dL  ? GFR, Estimated >60 >60 mL/min  ? Anion gap 10 5 - 15  ?Amylase     Status: None  ? Collection Time: 05/01/21  3:25 PM  ?Result Value Ref Range  ? Amylase 76 28 - 100 U/L  ?Lipase, blood     Status: Abnormal  ? Collection Time: 05/01/21  3:25 PM  ?Result Value Ref Range  ? Lipase 53 (H) 11 - 51 U/L  ? ?US Abdomen Complete ? ?Result Date: 05/01/2021 ?CLINICAL DATA:  Right upper quadrant pain. EXAM: ABDOMEN ULTRASOUND COMPLETE COMPARISON:  Renal ultrasound 01/15/2019. FINDINGS: Gallbladder: No gallstones or wall thickening visualized. No sonographic Murphy sign noted by sonographer. Common bile duct: Diameter: 1.8 mm. Liver: No focal lesion identified. Within normal limits in parenchymal echogenicity. Portal vein is patent on color Doppler imaging with normal direction of blood flow towards the liver. IVC: No abnormality visualized. Pancreas: Visualized portion unremarkable. Spleen: Mildly enlarged/borderline enlarged measuring 13.5 cm. Right Kidney: Length:  13.5 cm. Echogenicity within normal limits. No mass or hydronephrosis visualized. Proximal right ureter is mildly dilated. Left Kidney: Length: 12.5 cm. Echogenicity within normal limits. No mass or hy

## 2021-05-01 NOTE — MAU Note (Addendum)
Tracy Evans is a 36 y.o. at [redacted]w[redacted]d here in MAU reporting: RUQ pain that that began last night.  Reports pain is constant and sharp.  Reports taken Tylenol and used heat to alleviate pain, some relief noted but "not much". ? ?Onset of complaint: last night ?Pain score: 9/10 ?Vitals:  ? 05/01/21 1429  ?BP: 133/70  ?Pulse: (!) 105  ?Resp: 20  ?Temp: 98.7 ?F (37.1 ?C)  ?SpO2: 98%  ?   ?FHT: 154 bpm w/+FM.  Denies VB or LOF. ?Lab orders placed from triage:   UA ?

## 2021-05-02 LAB — RESP PANEL BY RT-PCR (FLU A&B, COVID) ARPGX2
Influenza A by PCR: NEGATIVE
Influenza B by PCR: NEGATIVE
SARS Coronavirus 2 by RT PCR: NEGATIVE

## 2021-05-02 MED ORDER — LIDOCAINE 5 % EX PTCH: 1.0000 | MEDICATED_PATCH | CUTANEOUS | 0 refills | Status: AC

## 2021-05-02 MED ORDER — LIDOCAINE 5 % EX PTCH
1.0000 | MEDICATED_PATCH | CUTANEOUS | Status: DC
  Administered 2021-05-02: 1 via TRANSDERMAL
  Filled 2021-05-02 (×2): qty 1

## 2021-05-02 MED ORDER — OXYCODONE HCL 5 MG PO TABS: 5.0000 mg | ORAL_TABLET | ORAL | 0 refills | Status: DC | PRN

## 2021-05-02 MED ORDER — CYCLOBENZAPRINE HCL 10 MG PO TABS: 10.0000 mg | ORAL_TABLET | Freq: Three times a day (TID) | ORAL | 0 refills | Status: AC

## 2021-05-02 NOTE — Discharge Summary (Signed)
? ?  Postpartum Discharge Summary ? ? ?   ?Patient Name: Tracy Evans ?DOB: [birth year] ?MRN: [id number] ? ?Date of admission: 05/01/2021 ?Delivery date:This patient has no babies on file. ?Delivering provider: This patient has no babies on file. ?Date of discharge: 05/02/2021 ? ?Admitting diagnosis: Abdominal pain affecting pregnancy, antepartum [O26.899, R10.9] ?Intrauterine pregnancy: [redacted]w[redacted]d     ?Secondary diagnosis:  Principal Problem: ?  Abdominal pain affecting pregnancy, antepartum ? ?Additional problems: RUQ pain    ?Discharge diagnosis:  RUQ pain                                               ? ?Hospital course: Pt admitted for eval of RUQ pain. Abdominal US and renal ultrasound essentially normal. FWB reassuring. Labs WNL. Pain improved with po oxycodone ? ? ?Physical exam  ?Vitals:  ? 05/02/21 7262 05/02/21 0737 05/02/21 1257 05/02/21 1258  ?BP: (!) 109/54 116/68 122/64   ?Pulse: 90 91 (!) 102 (!) 102  ?Resp: 20 20  20   ?Temp: 98.2 ?F (36.8 ?C) 97.8 ?F (36.6 ?C)  97.8 ?F (36.6 ?C)  ?TempSrc: Oral Oral  Oral  ?SpO2: 100% 100%  100%  ?Weight:      ?Height:      ? ?General: alert, cooperative, and no distress ? ?Labs: ?Lab Results  ?Component Value Date  ? WBC 8.9 05/01/2021  ? HGB 12.4 05/01/2021  ? HCT 35.0 (L) 05/01/2021  ? MCV 89.1 05/01/2021  ? PLT 196 05/01/2021  ? ? ?  Latest Ref Rng & Units 05/01/2021  ?  3:25 PM  ?CMP  ?Glucose 70 - 99 mg/dL 90    ?BUN 6 - 20 mg/dL <5    ?Creatinine 0.44 - 1.00 mg/dL 05/03/2021    ?Sodium 135 - 145 mmol/L 134    ?Potassium 3.5 - 5.1 mmol/L 3.6    ?Chloride 98 - 111 mmol/L 105    ?CO2 22 - 32 mmol/L 19    ?Calcium 8.9 - 10.3 mg/dL 8.2    ?Total Protein 6.5 - 8.1 g/dL 6.0    ?Total Bilirubin 0.3 - 1.2 mg/dL 0.4    ?Alkaline Phos 38 - 126 U/L 74    ?AST 15 - 41 U/L 20    ?ALT 0 - 44 U/L 19    ? ?Edinburgh Score: ? ?  05/07/2019  ? 10:00 AM  ?07/07/2019 Postnatal Depression Scale Screening Tool  ?I have been able to laugh and see the funny side of things. 0  ?I have looked forward  with enjoyment to things. 0  ?I have blamed myself unnecessarily when things went wrong. 1  ?I have been anxious or worried for no good reason. 1  ?I have felt scared or panicky for no good reason. 0  ?Things have been getting on top of me. 0  ?I have been so unhappy that I have had difficulty sleeping. 0  ?I have felt sad or miserable. 0  ?I have been so unhappy that I have been crying. 0  ?The thought of harming myself has occurred to me. 0  ?Edinburgh Postnatal Depression Scale Total 2  ? ? ? ? ?After visit meds:  ? ? ? ?Future Appointments:No future appointments. ?Follow up Visit: ? Follow-up Information   ? ? Meisinger, Todd, MD Follow up in 1 week(s).   ?Specialty: Obstetrics and Gynecology ?Why: Fo a  hospital follow-up visit ?Contact information: ?[address], [address] ?[place] [address] ?[phone] ? ? ?  ?  ? ?  ?  ? ?  ? ? ? ?  ? ?05/02/2021 ?Waynard Reeds, MD ? ? ?

## 2021-05-02 NOTE — H&P (Signed)
Tracy Evans is a 36 y.o. female, G4 P58, EGA [redacted]W[redacted]D with [place] 6-15 presenting for increasing RUQ pain.  Since yesterday she has RUQ pain up to 9/10 intensity, comes and goes, random.  No other symptoms, no F/C/N/V, normal urination and BM.  PNC uncomplicated so far, previous LTCS x 3.  On eval in MAU, abd u/s remarkable only for slight splenomegaly, mildly dilated proximal right ureter but no obvious stones, normal GB-no stones.  Labs essentially normal.  In MAU pain persisted so admitted for observation. ? ? ?OB History   ? ? Gravida  ?4  ? Para  ?3  ? Term  ?3  ? Preterm  ?   ? AB  ?   ? Living  ?3  ?  ? ? SAB  ?   ? IAB  ?   ? Ectopic  ?   ? Multiple  ?0  ? Live Births  ?3  ?   ?  ?  ? ?Past Medical History:  ?Diagnosis Date  ? GERD (gastroesophageal reflux disease)   ? H/O cesarean section complicating pregnancy 123XX123  ? Headache   ? Status post repeat low transverse cesarean section 05/07/2019  ? ?Past Surgical History:  ?Procedure Laterality Date  ? APPENDECTOMY    ? CESAREAN SECTION    ? CESAREAN SECTION N/A 02/09/2018  ? Procedure: REPEAT CESAREAN SECTION;  Surgeon: Janyth Contes, MD;  Location: [facility];  Service: Obstetrics;  Laterality: N/ANira Conn,  RNFA  ? CESAREAN SECTION N/A 05/07/2019  ? Procedure: CESAREAN SECTION;  Surgeon: Janyth Contes, MD;  Location: [facility];  Service: Obstetrics;  Laterality: N/ANira Conn,  RNFA  ? ?Family History: family history includes Cancer in her maternal grandmother and paternal grandmother; Healthy in her father and mother. ?Social History:  reports that she has never smoked. She has never used smokeless tobacco. She reports that she does not currently use alcohol. She reports that she does not use drugs. ? ? ?Review of Systems  ?Respiratory: Negative.    ?Cardiovascular: Negative.   ?Maternal Medical History:  ?Fetal activity: Perceived fetal activity is normal.   ?Prenatal complications: no prenatal complications ? ?  ?Blood pressure  134/70, pulse 80, temperature 99.1 ?F (37.3 ?C), temperature source Oral, resp. rate 18, height 5\' 8"  (1.727 m), weight 97.8 kg, SpO2 100 %, unknown if currently breastfeeding. ?Maternal Exam:  ?Abdomen: Patient reports the following abdominal tenderness: RUQ.  ?Surgical scars: low transverse.   ?Introitus: Normal vulva. Normal vagina.   ?Physical Exam ?Vitals reviewed.  ?Constitutional:   ?   Appearance: She is well-developed.  ?Cardiovascular:  ?   Rate and Rhythm: Normal rate and regular rhythm.  ?Pulmonary:  ?   Effort: Pulmonary effort is normal. No respiratory distress.  ?Abdominal:  ?   Palpations: Abdomen is soft.  ?   Tenderness: There is abdominal tenderness in the right upper quadrant.  ?   Comments: No CVAT  ?Genitourinary: ?   General: Normal vulva.  ?Neurological:  ?   Mental Status: She is alert.  ?  ? ?Assessment/Plan: ?IUP at 29 weeks with RUQ pain.  Normal labs, no obvious source on abdominal u/s, previous appendectomy.  She did have a similar episode of RUQ pain for 2 days last year prior to pregnancy.  I am unsure of the etiology of her pain.  Even though no gallstones, could have gallbladder dysfunction causing pain.  Will admit for observation, IV fluids, pain meds, flexeril for  possible muscle spasm, flomax for possible kidney stone.  ? ?Blane Ohara Isadore Bokhari ?05/02/2021, 3:38 AM ? ? ? ? ?

## 2021-05-02 NOTE — Plan of Care (Signed)
?  Problem: Education: ?Goal: Knowledge of General Education information will improve ?Description: Including pain rating scale, medication(s)/side effects and non-pharmacologic comfort measures ?Outcome: Completed/Met ?  ?Problem: Health Behavior/Discharge Planning: ?Goal: Ability to manage health-related needs will improve ?Outcome: Completed/Met ?  ?Problem: Clinical Measurements: ?Goal: Ability to maintain clinical measurements within normal limits will improve ?Outcome: Completed/Met ?Goal: Will remain free from infection ?Outcome: Completed/Met ?Goal: Diagnostic test results will improve ?Outcome: Completed/Met ?Goal: Respiratory complications will improve ?Outcome: Completed/Met ?Goal: Cardiovascular complication will be avoided ?Outcome: Completed/Met ?  ?Problem: Activity: ?Goal: Risk for activity intolerance will decrease ?Outcome: Completed/Met ?  ?Problem: Nutrition: ?Goal: Adequate nutrition will be maintained ?Outcome: Completed/Met ?  ?Problem: Coping: ?Goal: Level of anxiety will decrease ?Outcome: Completed/Met ?  ?Problem: Elimination: ?Goal: Will not experience complications related to bowel motility ?Outcome: Completed/Met ?Goal: Will not experience complications related to urinary retention ?Outcome: Completed/Met ?  ?Problem: Pain Managment: ?Goal: General experience of comfort will improve ?Outcome: Completed/Met ?  ?Problem: Safety: ?Goal: Ability to remain free from injury will improve ?Outcome: Completed/Met ?  ?Problem: Skin Integrity: ?Goal: Risk for impaired skin integrity will decrease ?Outcome: Completed/Met ?  ?Problem: Education: ?Goal: Knowledge of disease or condition will improve ?Outcome: Completed/Met ?Goal: Knowledge of the prescribed therapeutic regimen will improve ?Outcome: Completed/Met ?Goal: Individualized Educational Video(s) ?Outcome: Completed/Met ?  ?Problem: Clinical Measurements: ?Goal: Complications related to the disease process, condition or treatment will be  avoided or minimized ?Outcome: Completed/Met ?  ?

## 2021-05-02 NOTE — Progress Notes (Signed)
Patient ID: Tracy Evans, female   DOB: 09/20/1985, 36 y.o.   MRN: [id number] ? ?S: Denies N/V, RUQ pain improved with po oxycodone better than IV dilaudid. Notes on Thursday feeling like her abdomen enlarged significantly and felt like she'd done a major ab workout. URI went through household 1-2 weeks ago. +flatus, + fetal movement, no LOF ?O:  ?Vitals:  ? 05/01/21 1429 05/01/21 2320 05/02/21 0608 05/02/21 0737  ?BP: 133/70 134/70 (!) 109/54 116/68  ?Pulse: (!) 105 80 90 91  ?Resp: 20 18 20 20   ?Temp: 98.7 ?F (37.1 ?C) 99.1 ?F (37.3 ?C) 98.2 ?F (36.8 ?C) 97.8 ?F (36.6 ?C)  ?TempSrc: Oral Oral Oral Oral  ?SpO2: 98% 100% 100% 100%  ?Weight:      ?Height:      ? ?AOx3, NAD ?Abdomen soft, No significant tenderness on palpation. NO rebound or guarding. Upper abdomen tympanic. Negative Murphy's sign, no CVA tenderness ?FHR 160 ? ?Results for orders placed or performed during the hospital encounter of 05/01/21 (from the past 24 hour(s))  ?Urinalysis, Routine w reflex microscopic Urine, Clean Catch     Status: Abnormal  ? Collection Time: 05/01/21  3:06 PM  ?Result Value Ref Range  ? Color, Urine STRAW (A) YELLOW  ? APPearance CLEAR CLEAR  ? Specific Gravity, Urine 1.004 (L) 1.005 - 1.030  ? pH 6.0 5.0 - 8.0  ? Glucose, UA NEGATIVE NEGATIVE mg/dL  ? Hgb urine dipstick NEGATIVE NEGATIVE  ? Bilirubin Urine NEGATIVE NEGATIVE  ? Ketones, ur NEGATIVE NEGATIVE mg/dL  ? Protein, ur NEGATIVE NEGATIVE mg/dL  ? Nitrite NEGATIVE NEGATIVE  ? Leukocytes,Ua NEGATIVE NEGATIVE  ?CBC with Differential/Platelet     Status: Abnormal  ? Collection Time: 05/01/21  3:25 PM  ?Result Value Ref Range  ? WBC 8.9 4.0 - 10.5 K/uL  ? RBC 3.93 3.87 - 5.11 MIL/uL  ? Hemoglobin 12.4 12.0 - 15.0 g/dL  ? HCT 35.0 (L) 36.0 - 46.0 %  ? MCV 89.1 80.0 - 100.0 fL  ? MCH 31.6 26.0 - 34.0 pg  ? MCHC 35.4 30.0 - 36.0 g/dL  ? RDW 12.0 11.5 - 15.5 %  ? Platelets 196 150 - 400 K/uL  ? nRBC 0.0 0.0 - 0.2 %  ? Neutrophils Relative % 84 %  ? Neutro Abs 7.4 1.7 -  7.7 K/uL  ? Lymphocytes Relative 10 %  ? Lymphs Abs 0.9 0.7 - 4.0 K/uL  ? Monocytes Relative 6 %  ? Monocytes Absolute 0.5 0.1 - 1.0 K/uL  ? Eosinophils Relative 0 %  ? Eosinophils Absolute 0.0 0.0 - 0.5 K/uL  ? Basophils Relative 0 %  ? Basophils Absolute 0.0 0.0 - 0.1 K/uL  ? Immature Granulocytes 0 %  ? Abs Immature Granulocytes 0.04 0.00 - 0.07 K/uL  ?Comprehensive metabolic panel     Status: Abnormal  ? Collection Time: 05/01/21  3:25 PM  ?Result Value Ref Range  ? Sodium 134 (L) 135 - 145 mmol/L  ? Potassium 3.6 3.5 - 5.1 mmol/L  ? Chloride 105 98 - 111 mmol/L  ? CO2 19 (L) 22 - 32 mmol/L  ? Glucose, Bld 90 70 - 99 mg/dL  ? BUN <5 (L) 6 - 20 mg/dL  ? Creatinine, Ser 0.46 0.44 - 1.00 mg/dL  ? Calcium 8.2 (L) 8.9 - 10.3 mg/dL  ? Total Protein 6.0 (L) 6.5 - 8.1 g/dL  ? Albumin 2.9 (L) 3.5 - 5.0 g/dL  ? AST 20 15 - 41 U/L  ?  ALT 19 0 - 44 U/L  ? Alkaline Phosphatase 74 38 - 126 U/L  ? Total Bilirubin 0.4 0.3 - 1.2 mg/dL  ? GFR, Estimated >60 >60 mL/min  ? Anion gap 10 5 - 15  ?Amylase     Status: None  ? Collection Time: 05/01/21  3:25 PM  ?Result Value Ref Range  ? Amylase 76 28 - 100 U/L  ?Lipase, blood     Status: Abnormal  ? Collection Time: 05/01/21  3:25 PM  ?Result Value Ref Range  ? Lipase 53 (H) 11 - 51 U/L  ? ?US Abdomen Complete 05/01/2021 ? ?Narrative ?CLINICAL DATA:  Right upper quadrant pain. ? ?EXAM: ?ABDOMEN ULTRASOUND COMPLETE ? ?COMPARISON:  Renal ultrasound 01/15/2019. ? ?FINDINGS: ?Gallbladder: No gallstones or wall thickening visualized. No ?sonographic Murphy sign noted by sonographer. ? ?Common bile duct: Diameter: 1.8 mm. ? ?Liver: No focal lesion identified. Within normal limits in ?parenchymal echogenicity. Portal vein is patent on color Doppler ?imaging with normal direction of blood flow towards the liver. ? ?IVC: No abnormality visualized. ? ?Pancreas: Visualized portion unremarkable. ? ?Spleen: Mildly enlarged/borderline enlarged measuring 13.5 cm. ? ?Right Kidney: Length: 13.5 cm.  Echogenicity within normal limits. No ?mass or hydronephrosis visualized. Proximal right ureter is mildly ?dilated. ? ?Left Kidney: Length: 12.5 cm. Echogenicity within normal limits. No ?mass or hydronephrosis visualized. ? ?Abdominal aorta: No aneurysm visualized. ? ?Other findings: None. ? ?IMPRESSION: ?1. The proximal right ureters prominent/mildly dilated, but there is ?no hydronephrosis. ?2. Mild splenomegaly. ? ? ?Electronically Signed ?By: Darliss Cheney M.D. ?On: 05/01/2021 19:34 ? ?A/P: 36 yo [id number]@ 29+2 with RUQ pain ?1) RUQ pain: improved with po oxycodone. Labs and imaging studies all WNL. Consider MSK pain as cause. Will check Covid test d/t recent URI sxs and trial of topical lidocaine patch ? ? ? ? ? ?

## 2021-06-22 DIAGNOSIS — Z3685 Encounter for antenatal screening for Streptococcus B: Secondary | ICD-10-CM | POA: Diagnosis not present

## 2021-06-25 ENCOUNTER — Encounter (HOSPITAL_COMMUNITY): Payer: Self-pay

## 2021-06-25 NOTE — Patient Instructions (Addendum)
Tracy Evans  06/25/2021   Your procedure is scheduled on:  10/09/2021  Arrive at 0530 at [employer] [facility] on [address] at [facility]  and [employer]. You are invited to use the FREE valet parking or use the Visitor's parking deck.  Pick up the phone at the desk and dial [phone].  Call this number if you have problems the morning of surgery: [phone]  Remember:   Do not eat food:(After Midnight) Desps de medianoche.  Do not drink clear liquids: (After Midnight) Desps de medianoche.  Take these medicines the morning of surgery with A SIP OF WATER:  May take nexium as prescribed   Do not wear jewelry, make-up or nail polish.  Do not wear lotions, powders, or perfumes. Do not wear deodorant.  Do not shave 48 hours prior to surgery.  Do not bring valuables to the hospital.  [facility] is not   responsible for any belongings or valuables brought to the hospital.  Contacts, dentures or bridgework may not be worn into surgery.  Leave suitcase in the car. After surgery it may be brought to your room.  For patients admitted to the hospital, checkout time is 11:00 AM the day of              discharge.      Please read over the following fact sheets that you were given:     Preparing for Surgery

## 2021-07-07 ENCOUNTER — Encounter (HOSPITAL_COMMUNITY)
Admission: RE | Admit: 2021-07-07 | Discharge: 2021-07-07 | Disposition: A | Discharge status: Home / Self Care | Payer: BC Managed Care – PPO | Source: Ambulatory Visit | Attending: Obstetrics and Gynecology | Admitting: Obstetrics and Gynecology

## 2021-07-07 DIAGNOSIS — Z01812 Encounter for preprocedural laboratory examination: Secondary | ICD-10-CM | POA: Insufficient documentation

## 2021-07-07 DIAGNOSIS — O411230 Chorioamnionitis, third trimester, not applicable or unspecified: Secondary | ICD-10-CM | POA: Diagnosis not present

## 2021-07-07 DIAGNOSIS — Z98891 History of uterine scar from previous surgery: Secondary | ICD-10-CM | POA: Diagnosis not present

## 2021-07-07 DIAGNOSIS — O34219 Maternal care for unspecified type scar from previous cesarean delivery: Secondary | ICD-10-CM | POA: Insufficient documentation

## 2021-07-07 DIAGNOSIS — O34211 Maternal care for low transverse scar from previous cesarean delivery: Secondary | ICD-10-CM | POA: Diagnosis not present

## 2021-07-07 DIAGNOSIS — Z3A39 39 weeks gestation of pregnancy: Secondary | ICD-10-CM | POA: Diagnosis not present

## 2021-07-07 DIAGNOSIS — O4593 Premature separation of placenta, unspecified, third trimester: Secondary | ICD-10-CM | POA: Diagnosis not present

## 2021-07-07 DIAGNOSIS — Z23 Encounter for immunization: Secondary | ICD-10-CM | POA: Diagnosis not present

## 2021-07-07 LAB — CBC
HCT: 39.3 % (ref 36.0–46.0)
Hemoglobin: 13.3 g/dL (ref 12.0–15.0)
MCH: 30.6 pg (ref 26.0–34.0)
MCHC: 33.8 g/dL (ref 30.0–36.0)
MCV: 90.6 fL (ref 80.0–100.0)
Platelets: 195 10*3/uL (ref 150–400)
RBC: 4.34 MIL/uL (ref 3.87–5.11)
RDW: 12.3 % (ref 11.5–15.5)
WBC: 10 10*3/uL (ref 4.0–10.5)
nRBC: 0 % (ref 0.0–0.2)

## 2021-07-07 LAB — BASIC METABOLIC PANEL
Anion gap: 8 (ref 5–15)
BUN: 7 mg/dL (ref 6–20)
CO2: 18 mmol/L — ABNORMAL LOW (ref 22–32)
Calcium: 8.7 mg/dL — ABNORMAL LOW (ref 8.9–10.3)
Chloride: 109 mmol/L (ref 98–111)
Creatinine, Ser: 0.47 mg/dL (ref 0.44–1.00)
GFR, Estimated: >60 mL/min (ref >60)
Glucose, Bld: 97 mg/dL (ref 70–99)
Potassium: 3.8 mmol/L (ref 3.5–5.1)
Sodium: 135 mmol/L (ref 135–145)

## 2021-07-07 LAB — TYPE AND SCREEN
ABO/RH(D): AB POS
Antibody Screen: NEGATIVE

## 2021-07-07 LAB — RPR: RPR Ser Ql: NONREACTIVE

## 2021-07-08 NOTE — H&P (Signed)
Tracy Evans is a 36 y.o. female [id number] at 66 0/7 weeks ( EDD 07/16/21 by LMP c/w 9 week [place]) presenting for scheduled repeat c-section with h/o c-section x 3.  Prenatal care significant for: 1) Chronic low back pain  does dry needling 2) Migraine  fioricet prn 3) Advanced Maternal Age  declines genetic screens 4) History of cesarean section  Prior LTCS x 3 Repeat c-section planned at 39+ weeks   Declines tubal sterilization  OB History     Gravida  4   Para  3   Term  3   Preterm      AB      Living  3      SAB      IAB      Ectopic      Multiple  0   Live Births  3          Past Medical History:  Diagnosis Date   GERD (gastroesophageal reflux disease)    H/O cesarean section complicating pregnancy 02/06/2018   Headache    Status post repeat low transverse cesarean section 05/07/2019   Past Surgical History:  Procedure Laterality Date   APPENDECTOMY     CESAREAN SECTION     CESAREAN SECTION N/A 02/09/2018   Procedure: REPEAT CESAREAN SECTION;  Surgeon: Sherian Rein, MD;  Location: [facility];  Service: Obstetrics;  Laterality: N/AHerbert Seta,  RNFA   CESAREAN SECTION N/A 05/07/2019   Procedure: CESAREAN SECTION;  Surgeon: Sherian Rein, MD;  Location: [facility];  Service: Obstetrics;  Laterality: N/A;  [name],  RNFA   Family History: family history includes Cancer in her maternal grandmother and paternal grandmother; Healthy in her father and mother; Hypertension in her father. Social History:  reports that she has never smoked. She has never used smokeless tobacco. She reports that she does not currently use alcohol. She reports that she does not use drugs.     Maternal Diabetes: No Genetic Screening: Declined Maternal Ultrasounds/Referrals: Normal Fetal Ultrasounds or other Referrals:  None Maternal Substance Abuse:  No Significant Maternal Medications:  None Significant Maternal Lab Results:  Group B Strep negative Other  Comments:  None  Review of Systems  Constitutional:  Negative for fever.  Gastrointestinal:  Negative for abdominal pain.  Genitourinary:  Negative for vaginal bleeding.  Maternal Medical History:  Contractions: Frequency: rare.   Perceived severity is mild.   Fetal activity: Perceived fetal activity is normal.   Prenatal complications: Back pain, prior c-section x 3 Prenatal Complications - Diabetes: none.    unknown if currently breastfeeding. Maternal Exam:  Uterine Assessment: Contraction strength is mild.  Contraction frequency is rare.  Abdomen: Patient reports no abdominal tenderness. Surgical scars: low transverse.   Fetal presentation: vertex Introitus: Normal vulva. Normal vagina.   Physical Exam Constitutional:      Appearance: Normal appearance.  Cardiovascular:     Rate and Rhythm: Normal rate and regular rhythm.  Pulmonary:     Effort: Pulmonary effort is normal.  Abdominal:     Palpations: Abdomen is soft.  Genitourinary:    General: Normal vulva.  Neurological:     Mental Status: She is alert.  Psychiatric:        Mood and Affect: Mood normal.    Prenatal labs: ABO, Rh: --/--/AB POS (06/06 6301) Antibody: NEG (06/06 0903) Rubella: Immune (11/10 0000) RPR: NON REACTIVE (06/06 0903)  HBsAg: Negative (11/10 0000)  HIV: Non-reactive (11/10 0000)  GBS:  Negative One hour GCT 133 Hgb AA  Assessment/Plan:  Reviewed c-section procedure in detail with pt.  Discussed risks of bleeding, infection and possible damage to bowel and bladder.  Pt agreeable to transfusion if needed.  Ready to proceed.     Oliver Pila 07/08/2021, 6:52 PM

## 2021-07-09 ENCOUNTER — Inpatient Hospital Stay (HOSPITAL_COMMUNITY): Payer: BC Managed Care – PPO | Admitting: Anesthesiology

## 2021-07-09 ENCOUNTER — Encounter (HOSPITAL_COMMUNITY)
Admission: RE | Disposition: A | Discharge status: Home / Self Care | Payer: Self-pay | Source: Home / Self Care | Attending: Obstetrics and Gynecology

## 2021-07-09 ENCOUNTER — Encounter (HOSPITAL_COMMUNITY): Payer: Self-pay | Admitting: Obstetrics and Gynecology

## 2021-07-09 ENCOUNTER — Other Ambulatory Visit: Payer: Self-pay

## 2021-07-09 ENCOUNTER — Inpatient Hospital Stay (HOSPITAL_COMMUNITY)
Admission: RE | Admit: 2021-07-09 | Discharge: 2021-07-12 | DRG: 786 | Disposition: A | Discharge status: Home / Self Care | Payer: BC Managed Care – PPO | Attending: Obstetrics and Gynecology | Admitting: Obstetrics and Gynecology

## 2021-07-09 DIAGNOSIS — O34211 Maternal care for low transverse scar from previous cesarean delivery: Secondary | ICD-10-CM | POA: Diagnosis present

## 2021-07-09 DIAGNOSIS — Z3A39 39 weeks gestation of pregnancy: Secondary | ICD-10-CM | POA: Diagnosis not present

## 2021-07-09 DIAGNOSIS — O4593 Premature separation of placenta, unspecified, third trimester: Secondary | ICD-10-CM | POA: Diagnosis present

## 2021-07-09 DIAGNOSIS — Z98891 History of uterine scar from previous surgery: Secondary | ICD-10-CM

## 2021-07-09 DIAGNOSIS — O34219 Maternal care for unspecified type scar from previous cesarean delivery: Principal | ICD-10-CM

## 2021-07-09 SURGERY — Surgical Case
Anesthesia: Spinal

## 2021-07-09 MED ORDER — STERILE WATER FOR IRRIGATION IR SOLN
Status: DC | PRN
  Administered 2021-07-09: 1000 mL

## 2021-07-09 MED ORDER — SIMETHICONE 80 MG PO CHEW
80.0000 mg | CHEWABLE_TABLET | Freq: Three times a day (TID) | ORAL | Status: DC
Start: 2021-07-09 — End: 2021-07-12
  Administered 2021-07-09 – 2021-07-12 (×9): 80 mg via ORAL
  Filled 2021-07-09 (×9): qty 1

## 2021-07-09 MED ORDER — WITCH HAZEL-GLYCERIN EX PADS: 1.0000 "application " | MEDICATED_PAD | CUTANEOUS | Status: DC | PRN

## 2021-07-09 MED ORDER — COCONUT OIL OIL
1.0000 | TOPICAL_OIL | Status: DC | PRN
Start: 2021-07-09 — End: 2021-07-12

## 2021-07-09 MED ORDER — DEXMEDETOMIDINE HCL IN NACL 80 MCG/20ML IV SOLN
INTRAVENOUS | Status: AC
  Filled 2021-07-09: qty 20

## 2021-07-09 MED ORDER — DEXAMETHASONE SODIUM PHOSPHATE 10 MG/ML IJ SOLN
INTRAMUSCULAR | Status: AC
  Filled 2021-07-09: qty 1

## 2021-07-09 MED ORDER — PHENYLEPHRINE HCL-NACL 20-0.9 MG/250ML-% IV SOLN
INTRAVENOUS | Status: AC
  Filled 2021-07-09: qty 250

## 2021-07-09 MED ORDER — MORPHINE SULFATE (PF) 0.5 MG/ML IJ SOLN
INTRAMUSCULAR | Status: AC
  Filled 2021-07-09: qty 10

## 2021-07-09 MED ORDER — OXYTOCIN-SODIUM CHLORIDE 30-0.9 UT/500ML-% IV SOLN
INTRAVENOUS | Status: DC | PRN
  Administered 2021-07-09: 100 mL via INTRAVENOUS
  Administered 2021-07-09: 300 mL via INTRAVENOUS

## 2021-07-09 MED ORDER — SODIUM CHLORIDE 0.9 % IR SOLN
Status: DC | PRN
  Administered 2021-07-09: 1

## 2021-07-09 MED ORDER — KETOROLAC TROMETHAMINE 30 MG/ML IJ SOLN
INTRAMUSCULAR | Status: AC
  Filled 2021-07-09: qty 1

## 2021-07-09 MED ORDER — NALOXONE HCL 0.4 MG/ML IJ SOLN: 0.4000 mg | INTRAMUSCULAR | Status: DC | PRN

## 2021-07-09 MED ORDER — PRENATAL MULTIVITAMIN CH
1.0000 | ORAL_TABLET | Freq: Every day | ORAL | Status: DC
  Filled 2021-07-09 (×2): qty 1

## 2021-07-09 MED ORDER — NALOXONE HCL 4 MG/10ML IJ SOLN: 1.0000 ug/kg/h | INTRAVENOUS | Status: DC | PRN

## 2021-07-09 MED ORDER — CEFAZOLIN SODIUM-DEXTROSE 2-4 GM/100ML-% IV SOLN
INTRAVENOUS | Status: AC
  Filled 2021-07-09: qty 100

## 2021-07-09 MED ORDER — DIPHENHYDRAMINE HCL 25 MG PO CAPS: 25.0000 mg | ORAL_CAPSULE | Freq: Four times a day (QID) | ORAL | Status: DC | PRN

## 2021-07-09 MED ORDER — LACTATED RINGERS IV SOLN: INTRAVENOUS | Status: DC | PRN

## 2021-07-09 MED ORDER — SODIUM CHLORIDE 0.9% FLUSH: 3.0000 mL | INTRAVENOUS | Status: DC | PRN

## 2021-07-09 MED ORDER — SIMETHICONE 80 MG PO CHEW: 80.0000 mg | CHEWABLE_TABLET | ORAL | Status: DC | PRN

## 2021-07-09 MED ORDER — ONDANSETRON HCL 4 MG/2ML IJ SOLN: 4.0000 mg | Freq: Three times a day (TID) | INTRAMUSCULAR | Status: DC | PRN

## 2021-07-09 MED ORDER — SCOPOLAMINE 1 MG/3DAYS TD PT72
MEDICATED_PATCH | TRANSDERMAL | Status: AC
  Filled 2021-07-09: qty 1

## 2021-07-09 MED ORDER — TRANEXAMIC ACID-NACL 1000-0.7 MG/100ML-% IV SOLN
INTRAVENOUS | Status: AC
  Filled 2021-07-09: qty 100

## 2021-07-09 MED ORDER — BUPIVACAINE IN DEXTROSE 0.75-8.25 % IT SOLN
INTRATHECAL | Status: DC | PRN
  Administered 2021-07-09: 1.8 mL via INTRATHECAL

## 2021-07-09 MED ORDER — ONDANSETRON HCL 4 MG/2ML IJ SOLN
INTRAMUSCULAR | Status: DC | PRN
  Administered 2021-07-09: 4 mg via INTRAVENOUS

## 2021-07-09 MED ORDER — DIBUCAINE (PERIANAL) 1 % EX OINT: 1.0000 "application " | TOPICAL_OINTMENT | CUTANEOUS | Status: DC | PRN

## 2021-07-09 MED ORDER — IBUPROFEN 600 MG PO TABS
600.0000 mg | ORAL_TABLET | Freq: Four times a day (QID) | ORAL | Status: DC
  Administered 2021-07-10 – 2021-07-12 (×9): 600 mg via ORAL
  Filled 2021-07-09 (×10): qty 1

## 2021-07-09 MED ORDER — POVIDONE-IODINE 10 % EX SWAB: 2.0000 "application " | Freq: Once | CUTANEOUS | Status: DC

## 2021-07-09 MED ORDER — TETANUS-DIPHTH-ACELL PERTUSSIS 5-2.5-18.5 LF-MCG/0.5 IM SUSY: 0.5000 mL | PREFILLED_SYRINGE | Freq: Once | INTRAMUSCULAR | Status: DC

## 2021-07-09 MED ORDER — MENTHOL 3 MG MT LOZG: 1.0000 | LOZENGE | OROMUCOSAL | Status: DC | PRN

## 2021-07-09 MED ORDER — ACETAMINOPHEN 500 MG PO TABS
1000.0000 mg | ORAL_TABLET | Freq: Four times a day (QID) | ORAL | Status: DC
  Administered 2021-07-09 – 2021-07-12 (×11): 1000 mg via ORAL
  Filled 2021-07-09 (×13): qty 2

## 2021-07-09 MED ORDER — DEXMEDETOMIDINE (PRECEDEX) IN NS 20 MCG/5ML (4 MCG/ML) IV SYRINGE
PREFILLED_SYRINGE | INTRAVENOUS | Status: DC | PRN
  Administered 2021-07-09: 4 ug via INTRAVENOUS
  Administered 2021-07-09: 8 ug via INTRAVENOUS

## 2021-07-09 MED ORDER — OXYTOCIN-SODIUM CHLORIDE 30-0.9 UT/500ML-% IV SOLN: 2.5000 [IU]/h | INTRAVENOUS | Status: AC

## 2021-07-09 MED ORDER — KETOROLAC TROMETHAMINE 30 MG/ML IJ SOLN
30.0000 mg | Freq: Once | INTRAMUSCULAR | Status: AC | PRN
  Administered 2021-07-09: 30 mg via INTRAVENOUS

## 2021-07-09 MED ORDER — ACETAMINOPHEN 10 MG/ML IV SOLN
INTRAVENOUS | Status: DC | PRN
  Administered 2021-07-09: 1000 mg via INTRAVENOUS

## 2021-07-09 MED ORDER — OXYCODONE HCL 5 MG PO TABS
5.0000 mg | ORAL_TABLET | ORAL | Status: DC | PRN
  Administered 2021-07-09 – 2021-07-12 (×13): 10 mg via ORAL
  Filled 2021-07-09 (×11): qty 2
  Filled 2021-07-09: qty 1
  Filled 2021-07-09: qty 2

## 2021-07-09 MED ORDER — TRANEXAMIC ACID-NACL 1000-0.7 MG/100ML-% IV SOLN
INTRAVENOUS | Status: DC | PRN
  Administered 2021-07-09: 1000 mg via INTRAVENOUS

## 2021-07-09 MED ORDER — ZOLPIDEM TARTRATE 5 MG PO TABS: 5.0000 mg | ORAL_TABLET | Freq: Every evening | ORAL | Status: DC | PRN

## 2021-07-09 MED ORDER — DIPHENHYDRAMINE HCL 25 MG PO CAPS: 25.0000 mg | ORAL_CAPSULE | ORAL | Status: DC | PRN

## 2021-07-09 MED ORDER — SCOPOLAMINE 1 MG/3DAYS TD PT72
1.0000 | MEDICATED_PATCH | Freq: Once | TRANSDERMAL | Status: AC
  Administered 2021-07-09: 1.5 mg via TRANSDERMAL

## 2021-07-09 MED ORDER — LACTATED RINGERS IV SOLN: INTRAVENOUS | Status: DC

## 2021-07-09 MED ORDER — FENTANYL CITRATE (PF) 100 MCG/2ML IJ SOLN
INTRAMUSCULAR | Status: AC
  Filled 2021-07-09: qty 2

## 2021-07-09 MED ORDER — ONDANSETRON HCL 4 MG/2ML IJ SOLN
INTRAMUSCULAR | Status: AC
  Filled 2021-07-09: qty 2

## 2021-07-09 MED ORDER — FENTANYL CITRATE (PF) 100 MCG/2ML IJ SOLN: INTRAMUSCULAR | Status: DC | PRN

## 2021-07-09 MED ORDER — ACETAMINOPHEN 10 MG/ML IV SOLN
INTRAVENOUS | Status: AC
  Filled 2021-07-09: qty 100

## 2021-07-09 MED ORDER — MEPERIDINE HCL 25 MG/ML IJ SOLN: 6.2500 mg | INTRAMUSCULAR | Status: DC | PRN

## 2021-07-09 MED ORDER — FENTANYL CITRATE (PF) 100 MCG/2ML IJ SOLN
INTRAMUSCULAR | Status: DC | PRN
Start: 2021-07-09 — End: 2021-07-09
  Administered 2021-07-09: 15 ug via INTRATHECAL

## 2021-07-09 MED ORDER — KETOROLAC TROMETHAMINE 30 MG/ML IJ SOLN
30.0000 mg | Freq: Four times a day (QID) | INTRAMUSCULAR | Status: DC | PRN
  Administered 2021-07-09 – 2021-07-10 (×3): 30 mg via INTRAVENOUS
  Filled 2021-07-09 (×2): qty 1

## 2021-07-09 MED ORDER — CEFAZOLIN SODIUM-DEXTROSE 2-4 GM/100ML-% IV SOLN
2.0000 g | INTRAVENOUS | Status: AC
  Administered 2021-07-09: 2 g via INTRAVENOUS

## 2021-07-09 MED ORDER — MORPHINE SULFATE (PF) 0.5 MG/ML IJ SOLN
INTRAMUSCULAR | Status: DC | PRN
  Administered 2021-07-09: .25 mg via INTRATHECAL

## 2021-07-09 MED ORDER — SENNOSIDES-DOCUSATE SODIUM 8.6-50 MG PO TABS
2.0000 | ORAL_TABLET | Freq: Every day | ORAL | Status: DC
  Administered 2021-07-10 – 2021-07-12 (×3): 2 via ORAL
  Filled 2021-07-09 (×3): qty 2

## 2021-07-09 MED ORDER — DEXAMETHASONE SODIUM PHOSPHATE 10 MG/ML IJ SOLN
INTRAMUSCULAR | Status: DC | PRN
  Administered 2021-07-09: 10 mg via INTRAVENOUS

## 2021-07-09 MED ORDER — DIPHENHYDRAMINE HCL 50 MG/ML IJ SOLN: 12.5000 mg | INTRAMUSCULAR | Status: DC | PRN

## 2021-07-09 MED ORDER — PHENYLEPHRINE HCL-NACL 20-0.9 MG/250ML-% IV SOLN
INTRAVENOUS | Status: DC | PRN
  Administered 2021-07-09: 60 ug/min via INTRAVENOUS

## 2021-07-09 SURGICAL SUPPLY — 35 items
BENZOIN TINCTURE PRP APPL 2/3 (GAUZE/BANDAGES/DRESSINGS) ×1 IMPLANT
CHLORAPREP W/TINT 26ML (MISCELLANEOUS) ×4 IMPLANT
CLAMP CORD UMBIL (MISCELLANEOUS) ×2 IMPLANT
CLOTH BEACON ORANGE TIMEOUT ST (SAFETY) ×2 IMPLANT
DRSG OPSITE POSTOP 4X10 (GAUZE/BANDAGES/DRESSINGS) ×2 IMPLANT
ELECT REM PT RETURN 9FT ADLT (ELECTROSURGICAL) ×2
ELECTRODE REM PT RTRN 9FT ADLT (ELECTROSURGICAL) ×1 IMPLANT
EXTRACTOR VACUUM KIWI (MISCELLANEOUS) IMPLANT
GLOVE BIO SURGEON STRL SZ 6.5 (GLOVE) ×2 IMPLANT
GLOVE BIOGEL PI IND STRL 7.0 (GLOVE) ×1 IMPLANT
GLOVE BIOGEL PI INDICATOR 7.0 (GLOVE) ×1
GOWN STRL REUS W/TWL LRG LVL3 (GOWN DISPOSABLE) ×4 IMPLANT
KIT ABG SYR 3ML LUER SLIP (SYRINGE) IMPLANT
NDL HYPO 25X5/8 SAFETYGLIDE (NEEDLE) IMPLANT
NEEDLE HYPO 25X5/8 SAFETYGLIDE (NEEDLE) IMPLANT
NS IRRIG 1000ML POUR BTL (IV SOLUTION) ×2 IMPLANT
PACK C SECTION WH (CUSTOM PROCEDURE TRAY) ×2 IMPLANT
PAD OB MATERNITY 4.3X12.25 (PERSONAL CARE ITEMS) ×2 IMPLANT
RTRCTR C-SECT PINK 25CM LRG (MISCELLANEOUS) ×2 IMPLANT
STRIP CLOSURE SKIN 1/2X4 (GAUZE/BANDAGES/DRESSINGS) ×1 IMPLANT
SUT CHROMIC 1 CTX 36 (SUTURE) ×4 IMPLANT
SUT PLAIN 0 NONE (SUTURE) IMPLANT
SUT PLAIN 2 0 XLH (SUTURE) ×2 IMPLANT
SUT VIC AB 0 CT1 27 (SUTURE) ×2
SUT VIC AB 0 CT1 27XBRD ANBCTR (SUTURE) ×2 IMPLANT
SUT VIC AB 2-0 CT1 27 (SUTURE) ×1
SUT VIC AB 2-0 CT1 TAPERPNT 27 (SUTURE) ×1 IMPLANT
SUT VIC AB 3-0 CT1 27 (SUTURE)
SUT VIC AB 3-0 CT1 TAPERPNT 27 (SUTURE) IMPLANT
SUT VIC AB 3-0 SH 27 (SUTURE) ×2
SUT VIC AB 3-0 SH 27X BRD (SUTURE) IMPLANT
SUT VIC AB 4-0 KS 27 (SUTURE) ×2 IMPLANT
TOWEL OR 17X24 6PK STRL BLUE (TOWEL DISPOSABLE) ×2 IMPLANT
TRAY FOLEY W/BAG SLVR 14FR LF (SET/KITS/TRAYS/PACK) ×2 IMPLANT
WATER STERILE IRR 1000ML POUR (IV SOLUTION) ×2 IMPLANT

## 2021-07-09 NOTE — Transfer of Care (Signed)
Immediate Anesthesia Transfer of Care Note  Patient: Tracy Evans  Procedure(s) Performed: CESAREAN SECTION  Patient Location: PACU  Anesthesia Type:Spinal  Level of Consciousness: awake  Airway & Oxygen Therapy: Patient Spontanous Breathing  Post-op Assessment: Report given to RN  Post vital signs: Reviewed and stable  Last Vitals:  Vitals Value Taken Time  BP    Temp    Pulse 79 07/09/21 0906  Resp 11 07/09/21 0906  SpO2 99 % 07/09/21 0906  Vitals shown include unvalidated device data.  Last Pain:  Vitals:   07/09/21 0545  TempSrc:   PainSc: 0-No pain         Complications: No notable events documented.

## 2021-07-09 NOTE — Anesthesia Postprocedure Evaluation (Signed)
Anesthesia Post Note  Patient: Tracy Evans  Procedure(s) Performed: CESAREAN SECTION     Patient location during evaluation: PACU Anesthesia Type: Spinal Level of consciousness: oriented and awake and alert Pain management: pain level controlled Vital Signs Assessment: post-procedure vital signs reviewed and stable Respiratory status: spontaneous breathing and respiratory function stable Cardiovascular status: blood pressure returned to baseline and stable Postop Assessment: no headache, no backache, no apparent nausea or vomiting and able to ambulate Anesthetic complications: no   No notable events documented.  Last Vitals:  Vitals:   07/09/21 1010 07/09/21 1107  BP: 128/77 128/74  Pulse: 72   Resp: 14 18  Temp: 36.4 C 36.4 C  SpO2: 97%     Last Pain:  Vitals:   07/09/21 1107  TempSrc: Oral  PainSc: 3    Pain Goal:                   Nelle Don [name]

## 2021-07-09 NOTE — Op Note (Signed)
Operative Note    Preoperative Diagnosis Term pregnancy at 50 0/7 weeks Prior c-section x 3  Postoperative Diagnosis Same with retroplacental clot c/w partial abruption  Procedure Repeat low transverse c-section with 2 layer closure of uterus  Surgeon Paula Compton, MD Jerelyn Charles, MD  Anesthesia Spinal  Fluids: EBL 1044mL UOP 268mL IVF 2050mL  Findings A viable female infant in the vertex presentation. Apgars 8.9 Weight 7#15oz Placenta noted to have retroplacental clot upon uterine incision c/w partial abruption. Nuchal cord x 2. Tubes and ovaries WNL.    Specimen Placenta to pathology  Procedure Note  Patient was taken to the operating room where spinal anesthesia was obtained and found to be adequate by Allis clamp test. She was prepped and draped in the normal sterile fashion in the dorsal supine position with a leftward tilt. An appropriate time out was performed. A Pfannenstiel skin incision was then made through a pre-existing scar with the scalpel and carried through to the underlying layer of fascia by sharp dissection and Bovie cautery. The fascia was nicked in the midline and the incision was extended laterally with Mayo scissors. The inferior aspect of the incision was grasped Coker clamps and dissected off the underlying rectus muscles. In a similar fashion the superior aspect was dissected off the rectus muscles. Rectus muscles were separated in the midline and the peritoneal cavity entered bluntly. The peritoneal incision was then extended both superiorly and inferiorly with careful attention to avoid both bowel and bladder. The Alexis self-retaining wound retractor was then placed within the incision and the lower uterine segment exposed. The bladder flap was developed with Metzenbaum scissors and pushed away from the lower uterine segment. The lower uterine segment was then incised in a transverse fashion and the cavity itself entered bluntly. Upon entry,  retroplacental clot was noted.  The incision was extended bluntly. The infant's head was then lifted and delivered up to the incision but could not quite be expressed with fundal pressure.  The Kiwi vacuum was applied and in the green zone with two pull the head delivered, no pop-offs.  The remainder of the infant delivered and the nose and mouth bulb suctioned with the cord clamped and cut as well. The infant was handed off to the waiting pediatricians. The placenta was then spontaneously expressed from the uterus and the uterus cleared of all clots and debris with moist lap sponge. The uterine incision was then repaired in 2 layers the first layer was a running locked layer of 1-0 chromic and the second an imbricating layer of the same suture. The LUS was thin and areas along the closure pulled through with oozing so were reinforced with 3-0 vicryl interrupted sutures. The tubes and ovaries were inspected and the gutters cleared of all clots and debris. The uterine incision was inspected and found to now be hemostatic. All instruments and sponges as well as the Alexis retractor were then removed from the abdomen. The rectus muscles and peritoneum were then reapproximated with a running suture of 2-0 Vicryl. The fascia was then closed with 0 Vicryl in a running fashion. Subcutaneous tissue was reapproximated with 3-0 plain in a running fashion. The skin was closed with a subcuticular stitch of 4-0 Vicryl on a Keith needle and then reinforced with benzoin and Steri-Strips. At the conclusion of the procedure all instruments and sponge counts were correct. Patient was taken to the recovery room in good condition with her baby accompanying her skin to skin.    D/w  pt circumcision and she declines.

## 2021-07-09 NOTE — Lactation Note (Addendum)
This note was copied from a baby's chart. Lactation Consultation Note  Patient Name: Boy Eilidh Marcano [id number] Date: 07/09/2021 Reason for consult: Initial assessment;Term;Breastfeeding assistance Age:38 hours  P4, Term, Female Infant  LC entered the room and mom was holding baby. Per mom, she has breast fed before and each of her children have had tongue and lip ties. Mom states that when latching her current baby, she felt some pinching and her nipple was pinched when taking baby off the breast. Mom states that she has had thrush 3 times during her other pregnancies and despite the oral restrictions, she has continued to breastfeed.   Mom says that she has tried to latch baby every 2 hours, but he has been sleepy.   LC gave mom 2 spoons and encouraged her to hand express into the spoon and feed the expressed milk to her baby.   LC reviewed outpatient LC services and encouraged mom to call for latch assistance when baby is showing feeding cues.   LC left her name on the board and mom will call for latch assistance if needed.   Current Feeding Plan: Breastfeed baby according to feeding cues 8+ times in 24 hours.  Hand express and feed expressed milk to baby.  Call for latch assistance if needed.   Maternal Data Does the patient have breastfeeding experience prior to this delivery?: Yes How long did the patient breastfeed?: Per mom, she has breast fed before. She breast fed for 10 months, 1 year, and 6 months.  Feeding Mother's Current Feeding Choice: Breast Milk  LATCH Score                    Lactation Tools Discussed/Used    Interventions Interventions: [facility] brochure  Discharge    Consult Status Consult Status: Follow-up Date: 07/10/21 Follow-up type: In-patient    Delene Loll 07/09/2021, 4:46 PM

## 2021-07-09 NOTE — Interval H&P Note (Signed)
History and Physical Interval Note:  07/09/2021 6:59 AM  Tracy Evans  has presented today for surgery, with the diagnosis of repeat c-section.  The various methods of treatment have been discussed with the patient and family. After consideration of risks, benefits and other options for treatment, the patient has consented to  Procedure(s): CESAREAN SECTION (N/A) as a surgical intervention.  The patient's history has been reviewed, patient examined, no change in status, stable for surgery.  I have reviewed the patient's chart and labs.  Questions were answered to the patient's satisfaction.     Logan Bores

## 2021-07-09 NOTE — Anesthesia Preprocedure Evaluation (Signed)
Anesthesia Evaluation  Patient identified by MRN, date of birth, ID band Patient awake    Reviewed: Allergy & Precautions, NPO status , Patient's Chart, lab work & pertinent test results  Airway Mallampati: II  TM Distance: >3 FB Neck ROM: Full    Dental no notable dental hx.    Pulmonary neg pulmonary ROS,    Pulmonary exam normal        Cardiovascular negative cardio ROS   Rhythm:Regular Rate:Normal     Neuro/Psych  Headaches, negative psych ROS   GI/Hepatic Neg liver ROS, GERD  Medicated,  Endo/Other  negative endocrine ROS  Renal/GU negative Renal ROS  negative genitourinary   Musculoskeletal negative musculoskeletal ROS (+)   Abdominal Normal abdominal exam  (+)   Peds  Hematology negative hematology ROS (+)   Anesthesia Other Findings   Reproductive/Obstetrics (+) Pregnancy                             Anesthesia Physical Anesthesia Plan  ASA: 2  Anesthesia Plan: Spinal   Post-op Pain Management:    Induction:   PONV Risk Score and Plan: 2 and Ondansetron, Dexamethasone and Treatment may vary due to age or medical condition  Airway Management Planned: Simple Face Mask, Nasal Cannula and Natural Airway  Additional Equipment: None  Intra-op Plan:   Post-operative Plan:   Informed Consent: I have reviewed the patients History and Physical, chart, labs and discussed the procedure including the risks, benefits and alternatives for the proposed anesthesia with the patient or authorized representative who has indicated his/her understanding and acceptance.     Dental advisory given  Plan Discussed with: CRNA  Anesthesia Plan Comments: (Lab Results      Component                Value               Date                      WBC                      10.0                07/07/2021                HGB                      13.3                07/07/2021                HCT                       39.3                07/07/2021                MCV                      90.6                07/07/2021                PLT                      195  07/07/2021          )        Anesthesia Quick Evaluation

## 2021-07-09 NOTE — Anesthesia Procedure Notes (Signed)
Spinal  Patient location during procedure: OR Start time: 07/09/2021 7:35 AM End time: 07/09/2021 7:38 AM Staffing Performed: anesthesiologist  Anesthesiologist: Atilano Median, DO Performed by: Atilano Median, DO Authorized by: Atilano Median, DO   Preanesthetic Checklist Completed: patient identified, IV checked, site marked, risks and benefits discussed, surgical consent, monitors and equipment checked, pre-op evaluation and timeout performed Spinal Block Patient position: sitting Prep: DuraPrep Patient monitoring: heart rate, cardiac monitor, continuous pulse ox and blood pressure Approach: midline Location: L3-4 Injection technique: single-shot Needle Needle type: Pencan  Needle gauge: 24 G Needle length: 10 cm Assessment Events: CSF return Additional Notes Patient identified. Risks/Benefits/Options discussed with patient including but not limited to bleeding, infection, nerve damage, paralysis, failed block, incomplete pain control, headache, blood pressure changes, nausea, vomiting, reactions to medications, itching and postpartum back pain. Confirmed with bedside nurse the patient's most recent platelet count. Confirmed with patient that they are not currently taking any anticoagulation, have any bleeding history or any family history of bleeding disorders. Patient expressed understanding and wished to proceed. All questions were answered. Sterile technique was used throughout the entire procedure. Please see nursing notes for vital signs. Warning signs of high block given to the patient including shortness of breath, tingling/numbness in hands, complete motor block, or any concerning symptoms with instructions to call for help. Patient was given instructions on fall risk and not to get out of bed. All questions and concerns addressed with instructions to call with any issues or inadequate analgesia.

## 2021-07-10 ENCOUNTER — Encounter (HOSPITAL_COMMUNITY): Payer: Self-pay | Admitting: Obstetrics and Gynecology

## 2021-07-10 LAB — CBC
HCT: 31 % — ABNORMAL LOW (ref 36.0–46.0)
Hemoglobin: 11 g/dL — ABNORMAL LOW (ref 12.0–15.0)
MCH: 32.1 pg (ref 26.0–34.0)
MCHC: 35.5 g/dL (ref 30.0–36.0)
MCV: 90.4 fL (ref 80.0–100.0)
Platelets: 164 10*3/uL (ref 150–400)
RBC: 3.43 MIL/uL — ABNORMAL LOW (ref 3.87–5.11)
RDW: 12.7 % (ref 11.5–15.5)
WBC: 13.5 10*3/uL — ABNORMAL HIGH (ref 4.0–10.5)
nRBC: 0 % (ref 0.0–0.2)

## 2021-07-10 NOTE — Lactation Note (Signed)
This note was copied from a baby's chart. Lactation Consultation Note  Patient Name: Boy Sanjana Folz YIFOY'D Date: 07/10/2021   Age:38 hours P4, term female infant, -5% weight loss. Per RN ( Amy), mom declined LC services tonight.  Maternal Data    Feeding    LATCH Score                    Lactation Tools Discussed/Used    Interventions    Discharge    Consult Status      Danelle Earthly 07/10/2021, 10:49 PM

## 2021-07-11 NOTE — Lactation Note (Signed)
This note was copied from a baby's chart. Lactation Consultation Note  Patient Name: Tracy Evans [id number] Date: 07/11/2021 Reason for consult: Follow-up assessment;Term Age:38 hours  P4 mother whose infant is now 23 hours old.  This is a term baby at 39+0 weeks.  Mother's current feeding preference is breast.  She breast fed all of her other children.    Mother reported that all of her other children have had a lip/tongue tie.  One child had a revision at 7 weeks.  Mother has been working on latching deeply.  She was preparing to latch again; observed her latching easily.  Encouraged a deeper latch into the breast tissue.  Baby had a wide gape and flanged lips; mother denied discomfort.  Observed him feeding for 10 minutes.  Will follow up tomorrow prior to discharge and provide a resource sheet for a consult if needed.  Suggested mother continue to work on latching and to get a consult after discharge if she feels like baby is not progressing with latching or her nipples become very painful.  At this time it appears she is doing a good job; nipples intact.  Also discussed the possibility of an OP lactation consult if desired.  Mother appreciative and will discuss tomorrow with me.  Grandmother present and will be a good support for mother.  She has also dealt with tongue/lip restrictions with her children.     Maternal Data    Feeding Mother's Current Feeding Choice: Breast Milk  LATCH Score Latch: Grasps breast easily, tongue down, lips flanged, rhythmical sucking.  Audible Swallowing: Spontaneous and intermittent  Type of Nipple: Everted at rest and after stimulation  Comfort (Breast/Nipple): Soft / non-tender  Hold (Positioning): Assistance needed to correctly position infant at breast and maintain latch.  LATCH Score: 9   Lactation Tools Discussed/Used    Interventions Interventions: Breast feeding basics reviewed;Assisted with latch;Skin to skin;Breast  compression;Position options;Support pillows;Adjust position;Education  Discharge Pump: Personal (Medela)  Consult Status Consult Status: Follow-up Date: 07/12/21 Follow-up type: In-patient    Dora Sims 07/11/2021, 6:12 PM

## 2021-07-11 NOTE — Progress Notes (Signed)
Subjective: Postpartum Day #2: Cesarean Delivery Patient reports incisional pain, tolerating PO, and no problems voiding.    Objective: Vital signs in last 24 hours: Temp:  [97.7 F (36.5 C)-98.4 F (36.9 C)] 98.4 F (36.9 C) (06/10 0526) Pulse Rate:  [74-85] 74 (06/10 0526) Resp:  [17-18] 18 (06/10 0526) BP: (109-122)/(64-74) 118/68 (06/10 0526) SpO2:  [96 %-98 %] 98 % (06/10 0526)  Physical Exam:  General: alert Lochia: appropriate Uterine Fundus: firm Incision: C/D/I  Recent Labs    07/10/21 0506  HGB 11.0*  HCT 31.0*    Assessment/Plan: Status post Cesarean section. Doing well postoperatively.  Continue current care, ambulate.  Leighton Roach Linah Klapper 07/11/2021, 8:56 AM

## 2021-07-12 MED ORDER — OXYCODONE HCL 5 MG PO TABS: 5.0000 mg | ORAL_TABLET | ORAL | 0 refills | Status: AC | PRN

## 2021-07-12 MED ORDER — IBUPROFEN 600 MG PO TABS: 600.0000 mg | ORAL_TABLET | Freq: Four times a day (QID) | ORAL | 0 refills | Status: AC

## 2021-07-12 NOTE — Discharge Instructions (Signed)
As per discharge pamphlet °

## 2021-07-12 NOTE — Discharge Summary (Signed)
Postpartum Discharge Summary      Patient Name: Tracy Evans DOB: 1985-07-11 MRN: [id number]  Date of admission: 07/09/2021 Delivery date:07/09/2021  Delivering provider: Huel Cote  Date of discharge: 07/12/2021  Admitting diagnosis: S/P repeat low transverse C-section [Z98.891] Intrauterine pregnancy: [redacted]w[redacted]d     Secondary diagnosis:  Principal Problem:   S/P repeat low transverse C-section    Discharge diagnosis: Term Pregnancy Delivered                                               Hospital course: Sceduled C/S   36 y.o. yo [id number] at [redacted]w[redacted]d was admitted to the hospital 07/09/2021 for scheduled cesarean section with the following indication:Elective Repeat.Delivery details are as follows:  Membrane Rupture Time/Date: 8:03 AM ,07/09/2021   Delivery Method:VBAC, Vacuum Assisted  Details of operation can be found in separate operative note.  Patient had an uncomplicated postpartum course.  She is ambulating, tolerating a regular diet, passing flatus, and urinating well. Patient is discharged home in stable condition on  07/12/21        Newborn Data: Birth date:07/09/2021  Birth time:8:06 AM  Gender:Female  Living status:Living  Apgars:8 ,9  Weight:3600 g      Physical exam  Vitals:   07/11/21 0526 07/11/21 1522 07/11/21 2045 07/12/21 0621  BP: 118/68 126/70 122/68 125/79  Pulse: 74 78  83  Resp: 18 18 17 17   Temp: 98.4 F (36.9 C) 98.4 F (36.9 C) 98.3 F (36.8 C) 97.8 F (36.6 C)  TempSrc: Oral Oral Oral Oral  SpO2: 98% 98% 99% 99%  Weight:      Height:       General: alert Lochia: appropriate Uterine Fundus: firm Incision: Healing well with no significant drainage  Labs: Lab Results  Component Value Date   WBC 13.5 (H) 07/10/2021   HGB 11.0 (L) 07/10/2021   HCT 31.0 (L) 07/10/2021   MCV 90.4 07/10/2021   PLT 164 07/10/2021      Latest Ref Rng & Units 07/07/2021    9:03 AM  CMP  Glucose 70 - 99 mg/dL 97   BUN 6 - 20 mg/dL 7   Creatinine 09/06/2021 -  1.00 mg/dL 6.75   Sodium 9.16 - 384 mmol/L 135   Potassium 3.5 - 5.1 mmol/L 3.8   Chloride 98 - 111 mmol/L 109   CO2 22 - 32 mmol/L 18   Calcium 8.9 - 10.3 mg/dL 8.7    Edinburgh Score:    07/09/2021   10:10 AM  Edinburgh Postnatal Depression Scale Screening Tool  I have been able to laugh and see the funny side of things. 0  I have looked forward with enjoyment to things. 0  I have blamed myself unnecessarily when things went wrong. 1  I have been anxious or worried for no good reason. 0  I have felt scared or panicky for no good reason. 0  Things have been getting on top of me. 1  I have been so unhappy that I have had difficulty sleeping. 0  I have felt sad or miserable. 0  I have been so unhappy that I have been crying. 0  The thought of harming myself has occurred to me. 0  Edinburgh Postnatal Depression Scale Total 2      After visit meds:  Allergies as of 07/12/2021   No  Known Allergies      Medication List     TAKE these medications    acetaminophen 500 MG tablet Commonly known as: TYLENOL Take 1,000 mg by mouth every 8 (eight) hours as needed (pain).   cyclobenzaprine 10 MG tablet Commonly known as: FLEXERIL Take 1 tablet (10 mg total) by mouth every 8 (eight) hours.   ibuprofen 600 MG tablet Commonly known as: ADVIL Take 1 tablet (600 mg total) by mouth every 6 (six) hours.   lidocaine 5 % Commonly known as: LIDODERM Place 1 patch onto the skin daily. Remove & Discard patch within 12 hours or as directed by MD   oxyCODONE 5 MG immediate release tablet Commonly known as: Oxy IR/ROXICODONE Take 1 tablet (5 mg total) by mouth every 4 (four) hours as needed for severe pain.   pantoprazole 40 MG tablet Commonly known as: PROTONIX Take 40 mg by mouth daily.   prenatal multivitamin Tabs tablet Take 1 tablet by mouth daily at 12 noon.   PROBIOTIC PO Take by mouth.   promethazine 25 MG tablet Commonly known as: PHENERGAN Take 25 mg by mouth daily as  needed for nausea/vomiting.         Discharge home in stable condition Infant Feeding: Breast Infant Disposition:home with mother Discharge instruction: per After Visit Summary and Postpartum booklet. Activity: Advance as tolerated. Pelvic rest for 6 weeks.  Diet: routine diet Postpartum Appointment:2 weeks Follow up Visit:  Follow-up Information     Huel Cote, MD. Schedule an appointment as soon as possible for a visit in 2 week(s).   Specialty: Obstetrics and Gynecology Contact information: [address] [place] [address] [phone]                     07/12/2021 Zenaida Niece, MD

## 2021-07-12 NOTE — Progress Notes (Signed)
POD #3 Doing well, ready to go home Afeb, VSS Abd- soft, fundus firm, incision intact D/c home

## 2021-07-13 LAB — SURGICAL PATHOLOGY

## 2021-07-16 ENCOUNTER — Inpatient Hospital Stay (HOSPITAL_COMMUNITY): Admit: 2021-07-16 | Payer: Self-pay

## 2021-07-18 ENCOUNTER — Telehealth (HOSPITAL_COMMUNITY): Payer: Self-pay

## 2021-07-18 NOTE — Telephone Encounter (Signed)
No answer. Left message to return nurse call.  Marcelino Duster [facility] 06/17//2023,1545

## 2021-08-17 DIAGNOSIS — Z1389 Encounter for screening for other disorder: Secondary | ICD-10-CM | POA: Diagnosis not present

## 2021-08-17 DIAGNOSIS — Z3009 Encounter for other general counseling and advice on contraception: Secondary | ICD-10-CM | POA: Diagnosis not present

## 2021-08-17 DIAGNOSIS — G43909 Migraine, unspecified, not intractable, without status migrainosus: Secondary | ICD-10-CM | POA: Diagnosis not present

## 2021-08-19 DIAGNOSIS — H1089 Other conjunctivitis: Secondary | ICD-10-CM | POA: Diagnosis not present

## 2021-10-15 DIAGNOSIS — L814 Other melanin hyperpigmentation: Secondary | ICD-10-CM | POA: Diagnosis not present

## 2021-10-15 DIAGNOSIS — L578 Other skin changes due to chronic exposure to nonionizing radiation: Secondary | ICD-10-CM | POA: Diagnosis not present

## 2021-10-15 DIAGNOSIS — D225 Melanocytic nevi of trunk: Secondary | ICD-10-CM | POA: Diagnosis not present

## 2021-10-15 DIAGNOSIS — D2271 Melanocytic nevi of right lower limb, including hip: Secondary | ICD-10-CM | POA: Diagnosis not present

## 2021-10-20 DIAGNOSIS — M545 Low back pain, unspecified: Secondary | ICD-10-CM | POA: Diagnosis not present

## 2021-10-30 DIAGNOSIS — M545 Low back pain, unspecified: Secondary | ICD-10-CM | POA: Diagnosis not present

## 2021-11-06 DIAGNOSIS — M545 Low back pain, unspecified: Secondary | ICD-10-CM | POA: Diagnosis not present

## 2021-11-13 DIAGNOSIS — M545 Low back pain, unspecified: Secondary | ICD-10-CM | POA: Diagnosis not present

## 2021-11-20 DIAGNOSIS — M545 Low back pain, unspecified: Secondary | ICD-10-CM | POA: Diagnosis not present

## 2021-12-11 DIAGNOSIS — M545 Low back pain, unspecified: Secondary | ICD-10-CM | POA: Diagnosis not present

## 2022-04-17 DIAGNOSIS — Z20822 Contact with and (suspected) exposure to covid-19: Secondary | ICD-10-CM | POA: Diagnosis not present

## 2022-04-17 DIAGNOSIS — J019 Acute sinusitis, unspecified: Secondary | ICD-10-CM | POA: Diagnosis not present

## 2022-10-28 DIAGNOSIS — Z130 Encounter for screening for diseases of the blood and blood-forming organs and certain disorders involving the immune mechanism: Secondary | ICD-10-CM | POA: Diagnosis not present

## 2022-10-28 DIAGNOSIS — D225 Melanocytic nevi of trunk: Secondary | ICD-10-CM | POA: Diagnosis not present

## 2022-10-28 DIAGNOSIS — L578 Other skin changes due to chronic exposure to nonionizing radiation: Secondary | ICD-10-CM | POA: Diagnosis not present

## 2022-10-28 DIAGNOSIS — D2271 Melanocytic nevi of right lower limb, including hip: Secondary | ICD-10-CM | POA: Diagnosis not present

## 2022-10-28 DIAGNOSIS — L814 Other melanin hyperpigmentation: Secondary | ICD-10-CM | POA: Diagnosis not present

## 2022-10-28 DIAGNOSIS — Z01419 Encounter for gynecological examination (general) (routine) without abnormal findings: Secondary | ICD-10-CM | POA: Diagnosis not present

## 2022-11-16 IMAGING — US US ABDOMEN COMPLETE
1 series · 15 of 25 positions shown · non-contrast
Comparison: Renal ultrasound 01/15/2019.

CLINICAL DATA: Right upper quadrant pain.

EXAM:
ABDOMEN ULTRASOUND COMPLETE

[Series 1: us abdomen complete · 15 of 75 slices shown]
[im 1/75]
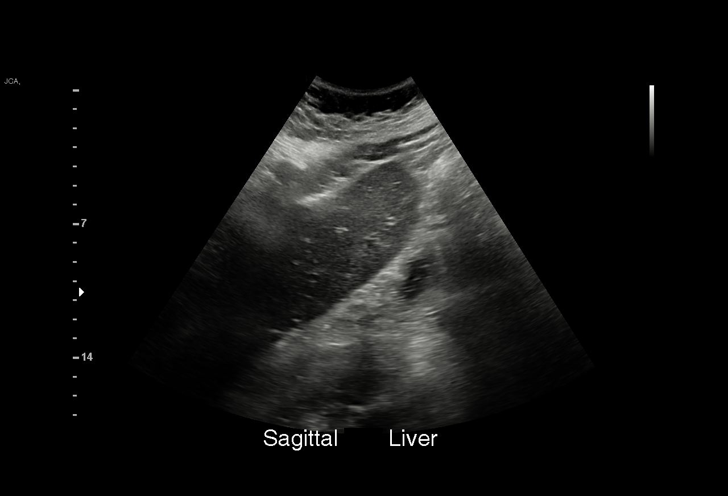
[im 7/75]
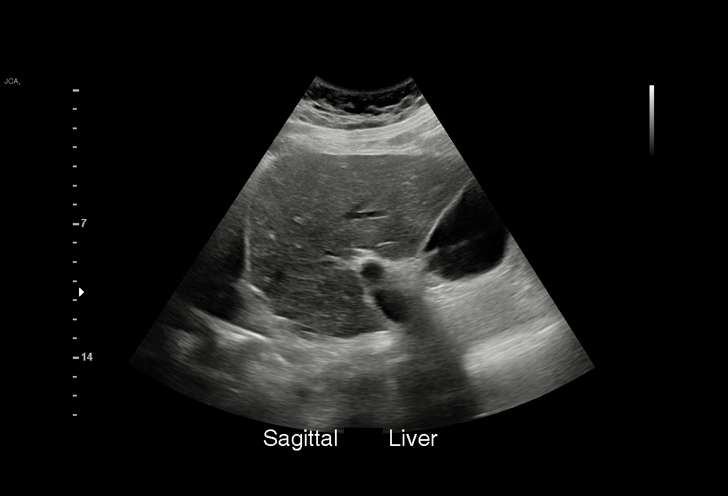
[im 13/75]
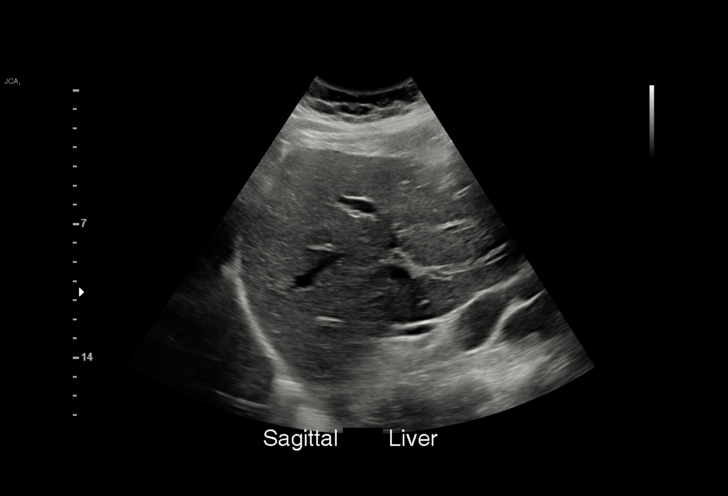
[im 16/75]
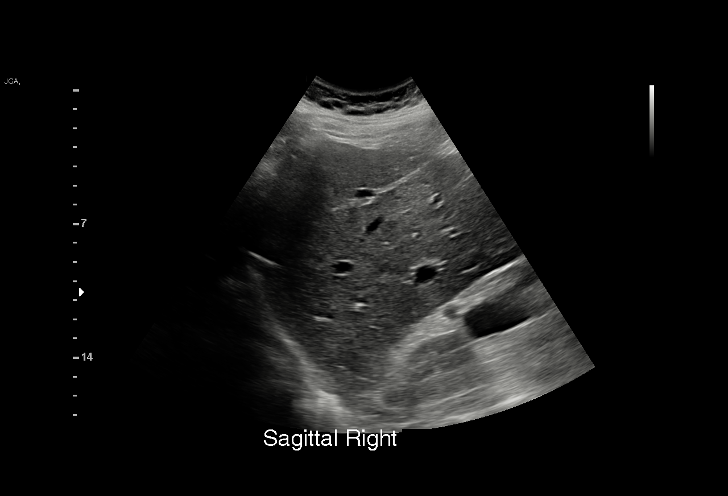
[im 22/75]
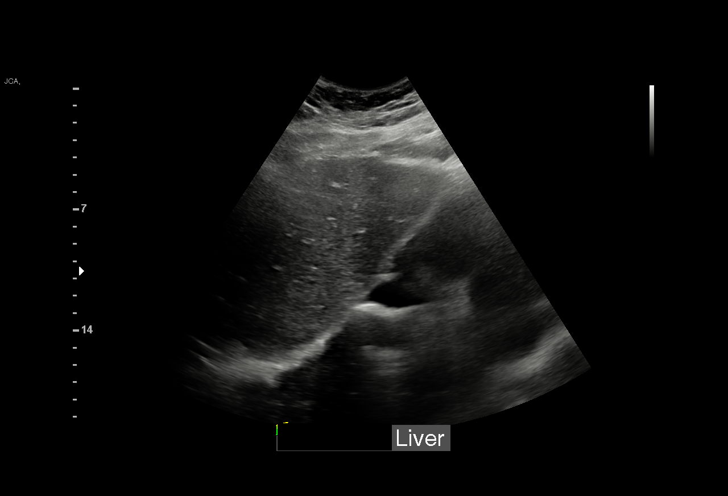
[im 28/75]
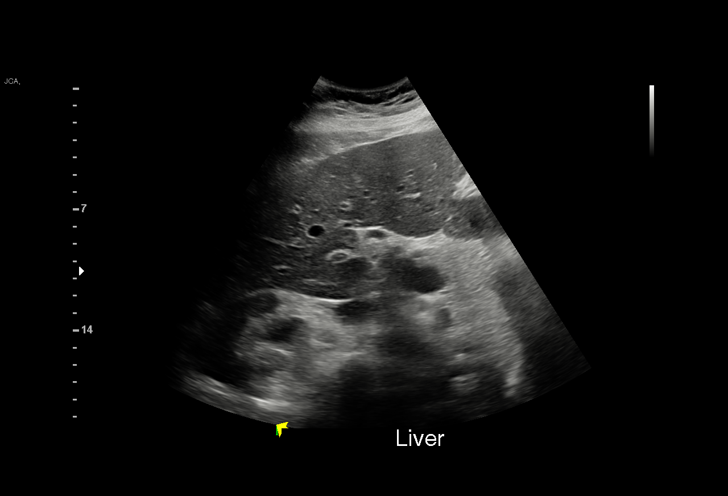
[im 31/75]
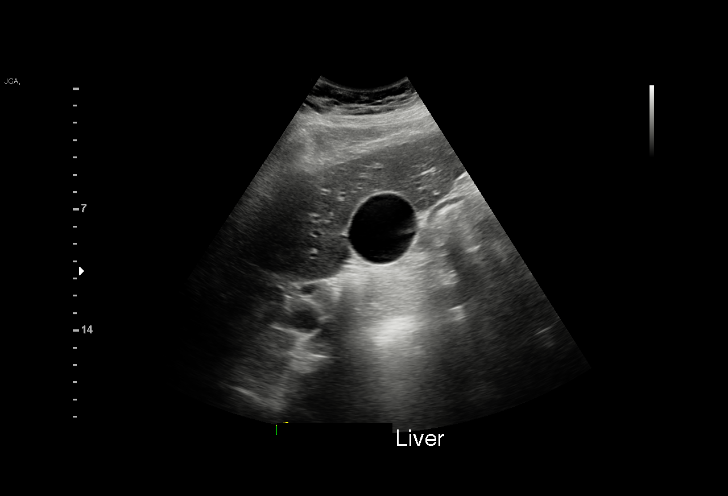
[im 38/75]
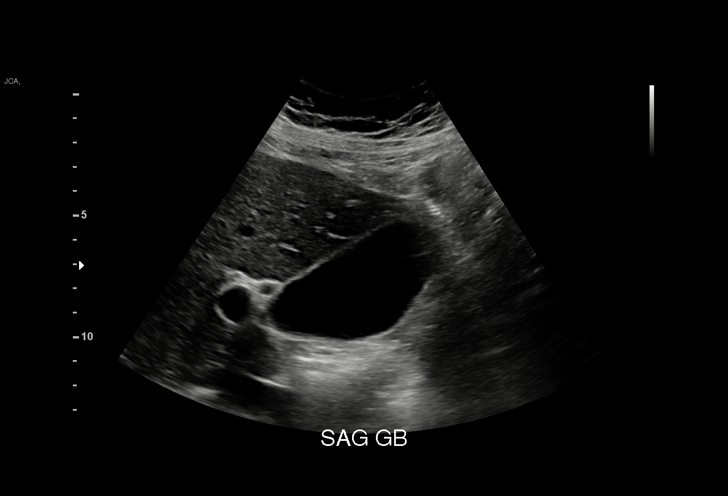
[im 44/75]
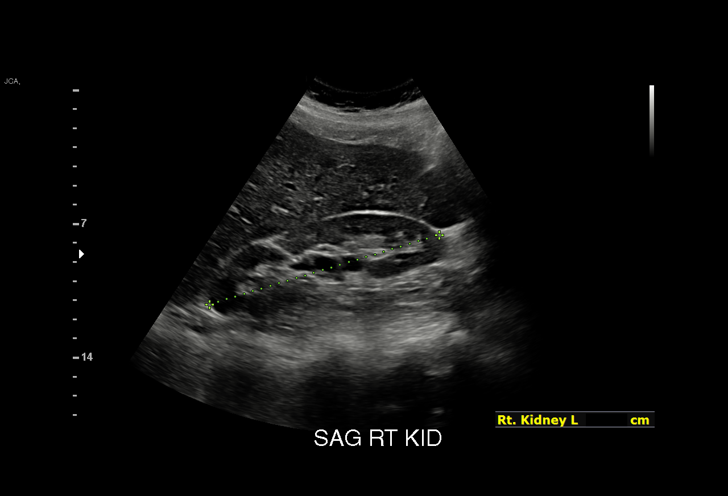
[im 47/75]
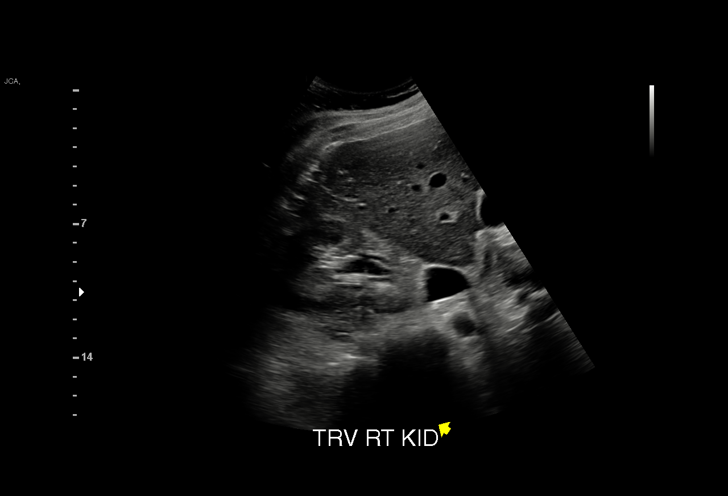
[im 53/75]
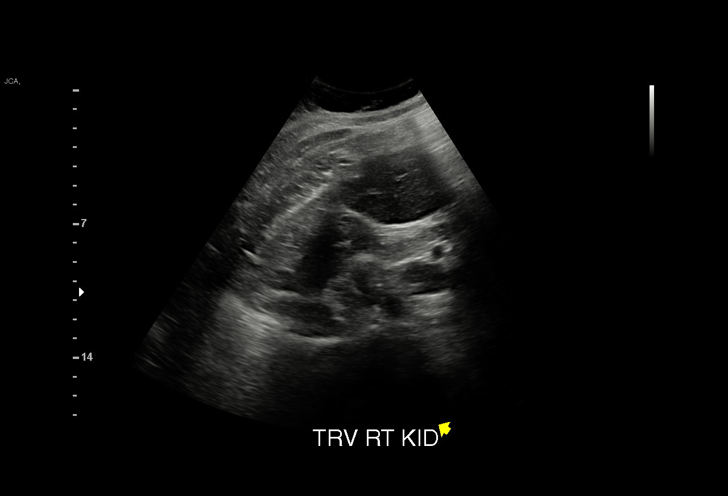
[im 59/75]
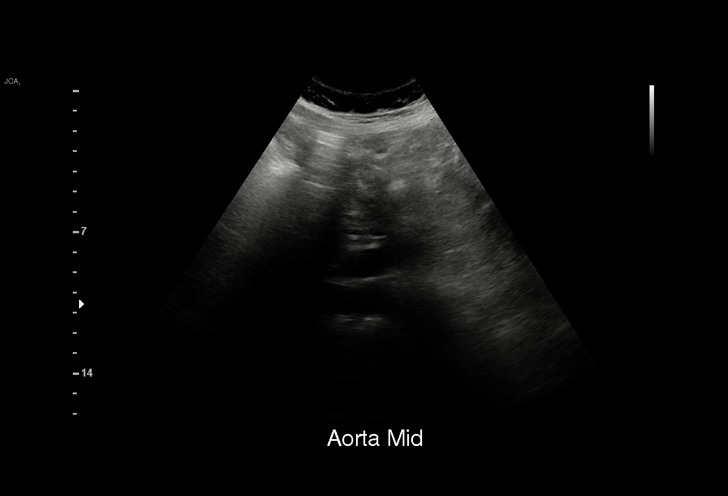
[im 62/75]
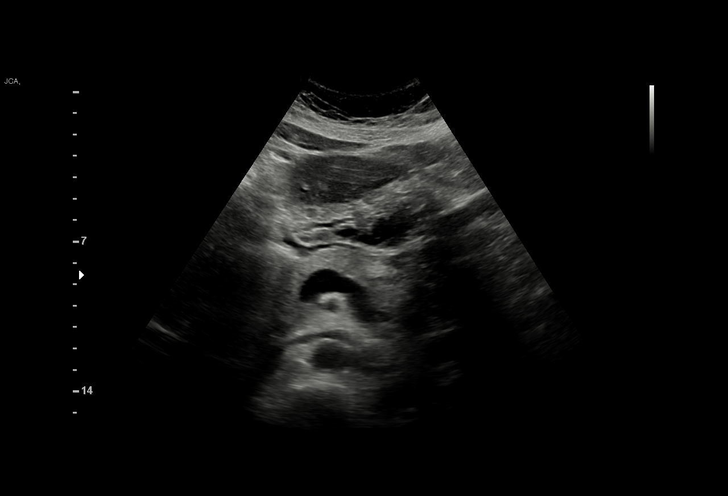
[im 68/75]
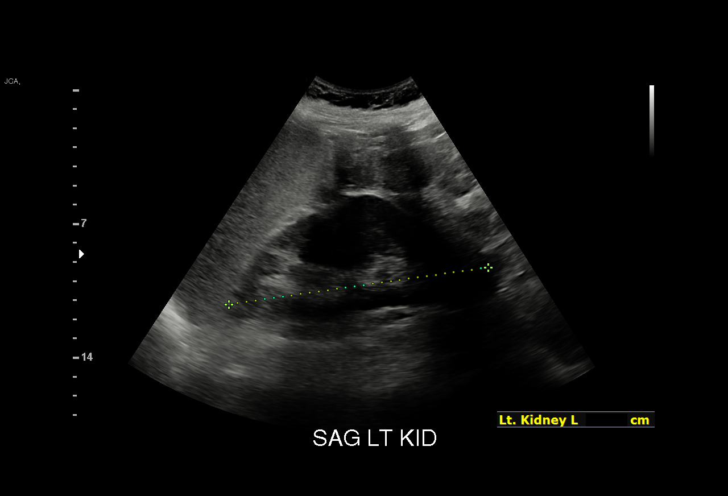
[im 75/75]
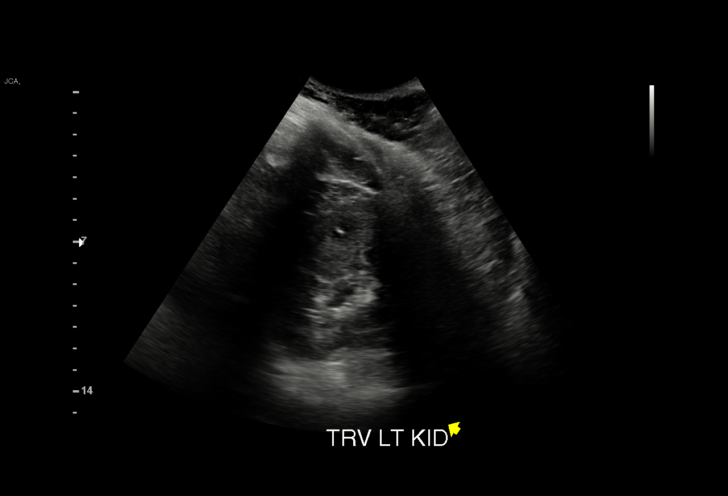

[15 of 25 positions shown; findings below may reference images not displayed]

FINDINGS: Gallbladder: No gallstones or wall thickening visualized. No
sonographic Murphy sign noted by sonographer.

Common bile duct: Diameter: 1.8 mm.

Liver: No focal lesion identified. Within normal limits in
parenchymal echogenicity. Portal vein is patent on color Doppler
imaging with normal direction of blood flow towards the liver.

IVC: No abnormality visualized.

Pancreas: Visualized portion unremarkable.

Spleen: Mildly enlarged/borderline enlarged measuring 13.5 cm.

Right Kidney: Length: 13.5 cm. Echogenicity within normal limits. No
mass or hydronephrosis visualized. Proximal right ureter is mildly
dilated.

Left Kidney: Length: 12.5 cm. Echogenicity within normal limits. No
mass or hydronephrosis visualized.

Abdominal aorta: No aneurysm visualized.

Other findings: None.
IMPRESSION: 1. The proximal right ureters prominent/mildly dilated, but there is
no hydronephrosis.
2. Mild splenomegaly.

## 2022-12-15 DIAGNOSIS — M170 Bilateral primary osteoarthritis of knee: Secondary | ICD-10-CM | POA: Diagnosis not present

## 2022-12-15 DIAGNOSIS — Z13220 Encounter for screening for lipoid disorders: Secondary | ICD-10-CM | POA: Diagnosis not present

## 2022-12-15 DIAGNOSIS — Z1329 Encounter for screening for other suspected endocrine disorder: Secondary | ICD-10-CM | POA: Diagnosis not present

## 2022-12-15 DIAGNOSIS — Z0000 Encounter for general adult medical examination without abnormal findings: Secondary | ICD-10-CM | POA: Diagnosis not present

## 2022-12-17 DIAGNOSIS — M170 Bilateral primary osteoarthritis of knee: Secondary | ICD-10-CM | POA: Diagnosis not present

## 2023-02-01 DIAGNOSIS — J3489 Other specified disorders of nose and nasal sinuses: Secondary | ICD-10-CM | POA: Diagnosis not present

## 2023-02-01 DIAGNOSIS — J018 Other acute sinusitis: Secondary | ICD-10-CM | POA: Diagnosis not present

## 2023-02-01 DIAGNOSIS — B9689 Other specified bacterial agents as the cause of diseases classified elsewhere: Secondary | ICD-10-CM | POA: Diagnosis not present

## 2023-05-31 DIAGNOSIS — R070 Pain in throat: Secondary | ICD-10-CM | POA: Diagnosis not present

## 2023-06-22 DIAGNOSIS — R051 Acute cough: Secondary | ICD-10-CM | POA: Diagnosis not present

## 2023-06-22 DIAGNOSIS — J3489 Other specified disorders of nose and nasal sinuses: Secondary | ICD-10-CM | POA: Diagnosis not present

## 2023-06-22 DIAGNOSIS — R0981 Nasal congestion: Secondary | ICD-10-CM | POA: Diagnosis not present

## 2023-06-22 DIAGNOSIS — R0602 Shortness of breath: Secondary | ICD-10-CM | POA: Diagnosis not present

## 2023-08-01 DIAGNOSIS — M5441 Lumbago with sciatica, right side: Secondary | ICD-10-CM | POA: Diagnosis not present

## 2023-08-08 DIAGNOSIS — M5459 Other low back pain: Secondary | ICD-10-CM | POA: Diagnosis not present

## 2023-08-11 DIAGNOSIS — M545 Low back pain, unspecified: Secondary | ICD-10-CM | POA: Diagnosis not present

## 2023-08-15 DIAGNOSIS — M545 Low back pain, unspecified: Secondary | ICD-10-CM | POA: Diagnosis not present

## 2023-08-19 DIAGNOSIS — M545 Low back pain, unspecified: Secondary | ICD-10-CM | POA: Diagnosis not present

## 2023-08-22 DIAGNOSIS — M5459 Other low back pain: Secondary | ICD-10-CM | POA: Diagnosis not present

## 2023-08-26 DIAGNOSIS — M545 Low back pain, unspecified: Secondary | ICD-10-CM | POA: Diagnosis not present

## 2023-09-11 DIAGNOSIS — M545 Low back pain, unspecified: Secondary | ICD-10-CM | POA: Diagnosis not present

## 2023-09-15 DIAGNOSIS — N390 Urinary tract infection, site not specified: Secondary | ICD-10-CM | POA: Diagnosis not present

## 2023-09-15 DIAGNOSIS — R300 Dysuria: Secondary | ICD-10-CM | POA: Diagnosis not present

## 2023-09-15 DIAGNOSIS — N3001 Acute cystitis with hematuria: Secondary | ICD-10-CM | POA: Diagnosis not present

## 2023-09-20 DIAGNOSIS — M5416 Radiculopathy, lumbar region: Secondary | ICD-10-CM | POA: Diagnosis not present

## 2023-10-03 DIAGNOSIS — N390 Urinary tract infection, site not specified: Secondary | ICD-10-CM | POA: Diagnosis not present

## 2023-10-03 DIAGNOSIS — R300 Dysuria: Secondary | ICD-10-CM | POA: Diagnosis not present

## 2023-10-03 DIAGNOSIS — N3000 Acute cystitis without hematuria: Secondary | ICD-10-CM | POA: Diagnosis not present

## 2023-10-03 DIAGNOSIS — R350 Frequency of micturition: Secondary | ICD-10-CM | POA: Diagnosis not present

## 2023-10-28 DIAGNOSIS — M545 Low back pain, unspecified: Secondary | ICD-10-CM | POA: Diagnosis not present

## 2023-11-02 DIAGNOSIS — Z01419 Encounter for gynecological examination (general) (routine) without abnormal findings: Secondary | ICD-10-CM | POA: Diagnosis not present

## 2023-11-02 DIAGNOSIS — Z1231 Encounter for screening mammogram for malignant neoplasm of breast: Secondary | ICD-10-CM | POA: Diagnosis not present

## 2023-11-02 DIAGNOSIS — Z130 Encounter for screening for diseases of the blood and blood-forming organs and certain disorders involving the immune mechanism: Secondary | ICD-10-CM | POA: Diagnosis not present

## 2023-11-10 DIAGNOSIS — R5383 Other fatigue: Secondary | ICD-10-CM | POA: Diagnosis not present

## 2023-11-10 DIAGNOSIS — G43019 Migraine without aura, intractable, without status migrainosus: Secondary | ICD-10-CM | POA: Diagnosis not present

## 2023-11-10 DIAGNOSIS — Z1321 Encounter for screening for nutritional disorder: Secondary | ICD-10-CM | POA: Diagnosis not present

## 2023-11-11 DIAGNOSIS — M545 Low back pain, unspecified: Secondary | ICD-10-CM | POA: Diagnosis not present

## 2023-11-15 DIAGNOSIS — R070 Pain in throat: Secondary | ICD-10-CM | POA: Diagnosis not present

## 2023-11-25 DIAGNOSIS — M545 Low back pain, unspecified: Secondary | ICD-10-CM | POA: Diagnosis not present

## 2023-12-02 DIAGNOSIS — L814 Other melanin hyperpigmentation: Secondary | ICD-10-CM | POA: Diagnosis not present

## 2023-12-02 DIAGNOSIS — D225 Melanocytic nevi of trunk: Secondary | ICD-10-CM | POA: Diagnosis not present

## 2023-12-02 DIAGNOSIS — D2271 Melanocytic nevi of right lower limb, including hip: Secondary | ICD-10-CM | POA: Diagnosis not present

## 2023-12-02 DIAGNOSIS — L578 Other skin changes due to chronic exposure to nonionizing radiation: Secondary | ICD-10-CM | POA: Diagnosis not present

## 2023-12-05 ENCOUNTER — Encounter: Payer: Self-pay | Admitting: Neurology

## 2023-12-22 DIAGNOSIS — M545 Low back pain, unspecified: Secondary | ICD-10-CM | POA: Diagnosis not present

## 2024-01-13 DIAGNOSIS — M545 Low back pain, unspecified: Secondary | ICD-10-CM | POA: Diagnosis not present

## 2024-03-20 ENCOUNTER — Ambulatory Visit: Admitting: Neurology
